# Patient Record
Sex: Male | Born: 1965 | Race: White | Hispanic: No | Marital: Married | Smoking: Never smoker
Health system: Southern US, Community
[De-identification: ages and names within clinical notes are randomized; demographics above are authoritative.]

## PROBLEM LIST (undated history)

## (undated) DIAGNOSIS — K219 Gastro-esophageal reflux disease without esophagitis: Secondary | ICD-10-CM

## (undated) DIAGNOSIS — E785 Hyperlipidemia, unspecified: Secondary | ICD-10-CM

## (undated) DIAGNOSIS — S92353A Displaced fracture of fifth metatarsal bone, unspecified foot, initial encounter for closed fracture: Secondary | ICD-10-CM

## (undated) HISTORY — PX: NO PAST SURGERIES: SHX2092

---

## 2007-10-12 ENCOUNTER — Ambulatory Visit: Payer: Self-pay | Admitting: Internal Medicine

## 2014-09-08 HISTORY — PX: FOOT SURGERY: SHX648

## 2016-05-01 ENCOUNTER — Ambulatory Visit
Admission: EM | Admit: 2016-05-01 | Discharge: 2016-05-01 | Disposition: A | Discharge status: Home / Self Care | Payer: BLUE CROSS/BLUE SHIELD | Attending: Family Medicine | Admitting: Family Medicine

## 2016-05-01 ENCOUNTER — Ambulatory Visit (INDEPENDENT_AMBULATORY_CARE_PROVIDER_SITE_OTHER): Payer: BLUE CROSS/BLUE SHIELD

## 2016-05-01 DIAGNOSIS — S99921A Unspecified injury of right foot, initial encounter: Secondary | ICD-10-CM

## 2016-05-01 DIAGNOSIS — S92354A Nondisplaced fracture of fifth metatarsal bone, right foot, initial encounter for closed fracture: Secondary | ICD-10-CM

## 2016-05-01 HISTORY — DX: Gastro-esophageal reflux disease without esophagitis: K21.9

## 2016-05-01 HISTORY — DX: Hyperlipidemia, unspecified: E78.5

## 2016-05-01 HISTORY — DX: Displaced fracture of fifth metatarsal bone, unspecified foot, initial encounter for closed fracture: S92.353A

## 2016-05-01 MED ORDER — HYDROCODONE-ACETAMINOPHEN 5-325 MG PO TABS: 1.0000 | ORAL_TABLET | Freq: Three times a day (TID) | ORAL | 0 refills | Status: DC | PRN

## 2016-05-01 MED ORDER — KETOROLAC TROMETHAMINE 60 MG/2ML IM SOLN
60.0000 mg | Freq: Once | INTRAMUSCULAR | Status: AC
  Administered 2016-05-01: 60 mg via INTRAMUSCULAR

## 2016-05-01 NOTE — ED Provider Notes (Signed)
[facility]    CSN: [id number] Arrival date & time: 05/01/16  1621  First Provider Contact:  First MD Initiated Contact with Patient 05/01/16 1658        History   Chief Complaint Chief Complaint  Patient presents with  . Foot Injury    HPI Clarence Adams is a 50 y.o. male.   Patient reports stepping off his deck about 2 hours ago the right foot is swollen and causing severe amount of pain. She's had difficulty putting weight on the foot.Marland Kitchen He states that the pain is ranging between a 7-9 out of 10 past smoker history he does not smoke no previous surgeries he has GERD and hyperlipidemia. No syncopal family medical history the past. Pertinent to today's visit   The history is provided by the patient. No language interpreter was used.  Foot Pain  This is a new problem. The current episode started 1 to 2 hours ago. The problem occurs constantly. The problem has been gradually worsening. Pertinent negatives include no chest pain, no abdominal pain, no headaches and no shortness of breath. The symptoms are aggravated by walking. Nothing relieves the symptoms. He has tried nothing for the symptoms. The treatment provided no relief.    Past Medical History:  Diagnosis Date  . Fracture of 5th metatarsal   . GERD (gastroesophageal reflux disease)   . Hyperlipidemia     There are no active problems to display for this patient.   Past Surgical History:  Procedure Laterality Date  . NO PAST SURGERIES         Home Medications    Prior to Admission medications   Medication Sig Start Date End Date Taking? Authorizing Provider  esomeprazole (NEXIUM) 40 MG capsule Take 40 mg by mouth daily at 12 noon.   Yes Historical Provider, MD  rosuvastatin (CRESTOR) 20 MG tablet Take 20 mg by mouth daily.   Yes Historical Provider, MD  HYDROcodone-acetaminophen (NORCO) 5-325 MG tablet Take 1 tablet by mouth every 8 (eight) hours as needed for moderate pain. 05/01/16   Frederich Cha, MD     Family History Family History  Problem Relation Age of Onset  . Aneurysm Father     Social History Social History  Substance Use Topics  . Smoking status: Never Smoker  . Smokeless tobacco: Never Used  . Alcohol use Yes     Comment: rarely     Allergies   Review of patient's allergies indicates no known allergies.   Review of Systems Review of Systems  Respiratory: Negative for shortness of breath.   Cardiovascular: Negative for chest pain.  Gastrointestinal: Negative for abdominal pain.  Musculoskeletal: Positive for arthralgias, joint swelling and myalgias.  Neurological: Negative for headaches.  All other systems reviewed and are negative.    Physical Exam Triage Vital Signs ED Triage Vitals  Enc Vitals Group     BP 05/01/16 1645 (!) 134/98     Pulse Rate 05/01/16 1645 68     Resp 05/01/16 1645 16     Temp 05/01/16 1645 97.6 F (36.4 C)     Temp Source 05/01/16 1645 Oral     SpO2 05/01/16 1645 97 %     Weight 05/01/16 1645 160 lb (72.6 kg)     Height 05/01/16 1645 5\' 11"  (1.803 m)     Head Circumference --      Peak Flow --      Pain Score 05/01/16 1648 9     Pain Loc --  Pain Edu? --      Excl. in [place]? --    No data found.   Updated Vital Signs BP (!) 134/98 (BP Location: Left Arm)   Pulse 68   Temp 97.6 F (36.4 C) (Oral)   Resp 16   Ht 5\' 11"  (1.803 m)   Wt 160 lb (72.6 kg)   SpO2 97%   BMI 22.32 kg/m   Visual Acuity Right Eye Distance:   Left Eye Distance:   Bilateral Distance:    Right Eye Near:   Left Eye Near:    Bilateral Near:     Physical Exam  Constitutional: He is oriented to person, place, and time. He appears well-developed and well-nourished.  HENT:  Head: Normocephalic.  Eyes: Pupils are equal, round, and reactive to light.  Neck: Normal range of motion.  Musculoskeletal: He exhibits edema and tenderness.       Right ankle: He exhibits decreased range of motion, swelling and ecchymosis. Tenderness. Achilles  tendon exhibits pain.       Feet:  Neurological: He is alert and oriented to person, place, and time.  Skin: Skin is warm.     UC Treatments / Results  Labs (all labs ordered are listed, but only abnormal results are displayed) Labs Reviewed - No data to display  EKG  EKG Interpretation None       Radiology Dg Foot Complete Right  Result Date: 05/01/2016 CLINICAL DATA:  Lateral foot pain/injury EXAM: RIGHT FOOT COMPLETE - 3+ VIEW COMPARISON:  None. FINDINGS: Minimally displaced fracture involving the base of the 5th metatarsal. Overlying mild soft tissue swelling. The joint spaces are preserved. IMPRESSION: Minimally displaced fracture involving the base of the 5th metatarsal. Electronically Signed   By: Julian Hy M.D.   On: 05/01/2016 17:55    Procedures Procedures (including critical care time)  Medications Ordered in UC Medications  ketorolac (TORADOL) injection 60 mg (60 mg Intramuscular Given 05/01/16 1756)     Initial Impression / Assessment and Plan / UC Course  I have reviewed the triage vital signs and the nursing notes.  Pertinent labs & imaging results that were available during my care of the patient were reviewed by me and considered in my medical decision making (see chart for details).  Clinical Course   Patient was informed of the fracture of the fifth metatarsal bone proximally. Will put patient in a cam boot and recommend follow-up with podiatrist Dr. Vickki Muff next week. Patient also be given Vicodin for pain. He does not need a work note since she's finishing up his 4 weeks vacation and has tobacco, Monday. He has some concern about staying on his feet explained to him that Dr. Vickki Muff would be the one to decide how much standing he'll be up to do.   Final Clinical Impressions(s) / UC Diagnoses   Final diagnoses:  Nondisplaced fracture of fifth metatarsal bone, right foot, initial encounter for closed fracture  Foot injury, right, initial  encounter    New Prescriptions Discharge Medication List as of 05/01/2016  6:19 PM    START taking these medications   Details  HYDROcodone-acetaminophen (NORCO) 5-325 MG tablet Take 1 tablet by mouth every 8 (eight) hours as needed for moderate pain., Starting Thu 05/01/2016, Normal         Frederich Cha, MD 05/01/16 [phone]

## 2016-05-01 NOTE — ED Notes (Signed)
Patient shows no signs of adverse reaction to medication at this time.  

## 2016-05-01 NOTE — ED Triage Notes (Signed)
Patient complains of right foot pain. Patient states that today around 3pm he was taking a big step down and felt a pop in his right foot. Patient has right foot swelling, bruising and pain that has been constant since injury.

## 2016-05-02 ENCOUNTER — Encounter: Payer: Self-pay | Admitting: Sports Medicine

## 2016-05-02 ENCOUNTER — Ambulatory Visit (INDEPENDENT_AMBULATORY_CARE_PROVIDER_SITE_OTHER): Payer: BLUE CROSS/BLUE SHIELD | Admitting: Sports Medicine

## 2016-05-02 DIAGNOSIS — M79672 Pain in left foot: Secondary | ICD-10-CM | POA: Diagnosis not present

## 2016-05-02 DIAGNOSIS — M25475 Effusion, left foot: Secondary | ICD-10-CM | POA: Diagnosis not present

## 2016-05-02 DIAGNOSIS — S92352A Displaced fracture of fifth metatarsal bone, left foot, initial encounter for closed fracture: Secondary | ICD-10-CM | POA: Diagnosis not present

## 2016-05-02 NOTE — Patient Instructions (Signed)
Pre-Operative Instructions  Congratulations, you have decided to take an important step to improving your quality of life.  You can be assured that the doctors of [facility] will be with you every step of the way.  1. Plan to be at the surgery center/hospital at least 1 (one) hour prior to your scheduled time unless otherwise directed by the surgical center/hospital staff.  You must have a responsible adult accompany you, remain during the surgery and drive you home.  Make sure you have directions to the surgical center/hospital and know how to get there on time. 2. For hospital based surgery you will need to obtain a history and physical form from your family physician within 1 month prior to the date of surgery- we will give you a form for you primary physician.  3. We make every effort to accommodate the date you request for surgery.  There are however, times where surgery dates or times have to be moved.  We will contact you as soon as possible if a change in schedule is required.   4. No Aspirin/Ibuprofen for one week before surgery.  If you are on aspirin, any non-steroidal anti-inflammatory medications (Mobic, Aleve, Ibuprofen) you should stop taking it 7 days prior to your surgery.  You make take Tylenol  For pain prior to surgery.  5. Medications- If you are taking daily heart and blood pressure medications, seizure, reflux, allergy, asthma, anxiety, pain or diabetes medications, make sure the surgery center/hospital is aware before the day of surgery so they may notify you which medications to take or avoid the day of surgery. 6. No food or drink after midnight the night before surgery unless directed otherwise by surgical center/hospital staff. 7. No alcoholic beverages 24 hours prior to surgery.  No smoking 24 hours prior to or 24 hours after surgery. 8. Wear loose pants or shorts- loose enough to fit over bandages, boots, and casts. 9. No slip on shoes, sneakers are best. 10. Bring  your boot with you to the surgery center/hospital.  Also bring crutches or a walker if your physician has prescribed it for you.  If you do not have this equipment, it will be provided for you after surgery. 11. If you have not been contracted by the surgery center/hospital by the day before your surgery, call to confirm the date and time of your surgery. 12. Leave-time from work may vary depending on the type of surgery you have.  Appropriate arrangements should be made prior to surgery with your employer. 13. Prescriptions will be provided immediately following surgery by your doctor.  Have these filled as soon as possible after surgery and take the medication as directed. 14. Remove nail polish on the operative foot. 15. Wash the night before surgery.  The night before surgery wash the foot and leg well with the antibacterial soap provided and water paying special attention to beneath the toenails and in between the toes.  Rinse thoroughly with water and dry well with a towel.  Perform this wash unless told not to do so by your physician.  Enclosed: 1 Ice pack (please put in freezer the night before surgery)   1 Hibiclens skin cleaner   Pre-op Instructions  If you have any questions regarding the instructions, do not hesitate to call our office.  [place]: [address] [place], [place] [address] [phone]  [place]: [address] [place], [place] [address] [phone]  [place]: [address]  [place], [place] [address] [phone]   Dr.   Ila Mcgill DPM, Dr. Celesta Gentile DPM, Dr. Lanelle Bal DPM, Dr. Landis Martins DPM, Dr. Edrick Kins DPM

## 2016-05-03 NOTE — Progress Notes (Signed)
Subjective: Clarence Adams is a 50 y.o. male patient who presents to office for evaluation of Right foot pain. Patient complains of progressive pain right foot after stepping off porch. Went to [facility] yesterday where he got xrays and was placed in CAM boot for fracture. Reports pain most with walking. Patient denies any other pedal complaints.   Patient seen and evaluated with Dr. Evans.   There are no active problems to display for this patient.   Current Outpatient Prescriptions on File Prior to Visit  Medication Sig Dispense Refill  . esomeprazole (NEXIUM) 40 MG capsule Take 40 mg by mouth daily at 12 noon.    . HYDROcodone-acetaminophen (NORCO) 5-325 MG tablet Take 1 tablet by mouth every 8 (eight) hours as needed for moderate pain. 20 tablet 0  . rosuvastatin (CRESTOR) 20 MG tablet Take 20 mg by mouth daily.     No current facility-administered medications on file prior to visit.     No Known Allergies  Objective:  General: Alert and oriented x3 in no acute distress  Dermatology: No open lesions bilateral lower extremities, no webspace macerations, no ecchymosis bilateral, all nails x 10 are well manicured.  Vascular: Focal edema noted to right foot lateral aspect. Dorsalis Pedis and Posterior Tibial pedal pulses 2/4, Capillary Fill Time 3 seconds,(+) pedal hair growth bilateral, Temperature gradient within normal limits.  Neurology: Gross sensation intact via light touch bilateral.  Musculoskeletal: There is tenderness with palpation at 5th met base on Right foot,No pain with calf compression bilateral. All joint range of motion is within normal limits except with inversion on right where there is mild guarding secondary to pain, Strength within normal limits in all groups bilateral.   Xrays Right foot 05-01-16 IMPRESSION: Minimally displaced fracture involving the base of the 5th metatarsal.    Assessment and Plan: Problem List Items Addressed This Visit    None     Visit Diagnoses    Fracture of fifth metatarsal bone of left foot, closed, initial encounter    -  Primary   Relevant Orders   DME Other see comment   Swelling of foot joint, left       Relevant Orders   DME Other see comment   Pain of left foot       Relevant Orders   DME Other see comment      -Complete examination performed -Xrays reviewed -Discussed treatement options for fracture; risks, alternatives, and benefits explained. -Patient opt for surgical management. Consent obtained for Right 5th metatarsal fracture reduction and placement of screw with Dr. Evans. Pre and Post op course explained. Risks, benefits, alternatives explained. No guarantees given or implied. Surgical booking slip submitted and provided patient with Surgical packet and info for [facility]. -Recommend 12 weeks medical leave for recovery post op -Continue with CAM walker to patient to wear at all times and instructed on use; Advised patient to return to using his crutches  -Ordered Rolling knee scooter for patient to have for use as well -Recommend protection, rest, ice, elevation daily  -Continue with Norco as needed for pain  -Patient to return to office after surgery or sooner if condition worsens.   , DPM  

## 2016-05-05 ENCOUNTER — Telehealth: Payer: Self-pay | Admitting: *Deleted

## 2016-05-05 NOTE — Telephone Encounter (Signed)
"  We're trying to figure out what is going on with my husband's surgery.  I tried to go on-line to get it set up and I couldn't.  They were asking me the doctor's name and the date of surgery and I didn't know either."  I'll have to call you back once I pull up his information.  He is a patient of Dr. Cannon Kettle."  "Okay, that will be fine."   I'm returning your call.  I'm sorry, he is a patient of Dr. Amalia Hailey.  He is scheduled already for surgery on 05/08/2016.   "He didn't know, no one called to tell him.  "No one told him while he was there on Friday seeing Dr. Amalia Hailey.  "No one one told him anything.  Where does he have to go? Are there directions on how to get there?"  Surgery is done at [facility], a brochure should be in his blue bag.  Did he get the blue bag?  "Yes, he did get the blue bag."  The brochure has all the information he needs, address is on the back, directions on how to get there is located on the back inner page and instructions on how to register is in the brochure.  "What time will he need to be there?"  He will receive a call from someone from the surgical center 1-2 days prior to surgery date.  "Okay, wish me luck.  I don't know if I'm ready for this."

## 2016-05-05 NOTE — Telephone Encounter (Signed)
"  I was calling to see if Dr. Amalia Hailey was doing a block or regular general.  I need to know because it matters in determining what time he needs to be here."

## 2016-05-07 NOTE — Telephone Encounter (Signed)
I called and informed Clarence Adams that Dr.Evans said he does not want a block.

## 2016-05-08 ENCOUNTER — Encounter: Payer: Self-pay | Admitting: Podiatry

## 2016-05-08 DIAGNOSIS — M21542 Acquired clubfoot, left foot: Secondary | ICD-10-CM | POA: Diagnosis not present

## 2016-05-09 NOTE — Progress Notes (Signed)
DOS 05/08/2016 Open reduction internal fixation 5th metatarsal fracture right foot. Any other indicated procedures.

## 2016-05-16 ENCOUNTER — Encounter: Payer: Self-pay | Admitting: Podiatry

## 2016-05-16 ENCOUNTER — Ambulatory Visit (INDEPENDENT_AMBULATORY_CARE_PROVIDER_SITE_OTHER): Payer: BLUE CROSS/BLUE SHIELD | Admitting: Podiatry

## 2016-05-16 DIAGNOSIS — S92354A Nondisplaced fracture of fifth metatarsal bone, right foot, initial encounter for closed fracture: Secondary | ICD-10-CM

## 2016-05-16 DIAGNOSIS — M79671 Pain in right foot: Secondary | ICD-10-CM

## 2016-05-16 DIAGNOSIS — M25474 Effusion, right foot: Secondary | ICD-10-CM

## 2016-05-16 DIAGNOSIS — M25475 Effusion, left foot: Secondary | ICD-10-CM

## 2016-05-16 DIAGNOSIS — M79672 Pain in left foot: Secondary | ICD-10-CM

## 2016-05-16 DIAGNOSIS — S92352A Displaced fracture of fifth metatarsal bone, left foot, initial encounter for closed fracture: Secondary | ICD-10-CM

## 2016-05-16 MED ORDER — TRAMADOL HCL 50 MG PO TABS: 50.0000 mg | ORAL_TABLET | Freq: Three times a day (TID) | ORAL | 0 refills | Status: DC | PRN

## 2016-05-16 NOTE — Progress Notes (Signed)
Subjective:  Patient presents today 1 week postop ORIF of a right fifth metatarsal fracture. Patient states that he is doing well however the pain medication prescribed to him with some jittery. Patient does complain of some pain and tenderness relating to the postoperative recovery.  Objective:  Incision site well coapted with sutures intact postoperative x-rays reveal correct alignment with no change immediately postoperatively versus today. Fracture fragment stable with orthopedic screw and correct placement.  Assessment:  1 week postop ORIF right fifth metatarsal avulsion fracture.  Plan of care:  #1 patient was evaluated #2 prescription for tramadol was given to the patient today #3 continue nonweightbearing with a cam boot for approximately 3 more weeks. #4 continue to keep dressings clean dry and intact until instructed otherwise #5 patient is to return to clinic in 1 week for suture removal

## 2016-05-22 NOTE — Progress Notes (Signed)
DOS 08.31.2017 Open reduction internal fixation fifth metatarsal fracture right foot; any other indicated procedure

## 2016-05-23 ENCOUNTER — Encounter: Payer: Self-pay | Admitting: Podiatry

## 2016-05-23 ENCOUNTER — Ambulatory Visit (INDEPENDENT_AMBULATORY_CARE_PROVIDER_SITE_OTHER): Payer: BLUE CROSS/BLUE SHIELD | Admitting: Podiatry

## 2016-05-23 VITALS — BP 116/77 | HR 66 | Resp 16

## 2016-05-23 DIAGNOSIS — M25475 Effusion, left foot: Secondary | ICD-10-CM

## 2016-05-23 DIAGNOSIS — S92351D Displaced fracture of fifth metatarsal bone, right foot, subsequent encounter for fracture with routine healing: Secondary | ICD-10-CM

## 2016-05-23 DIAGNOSIS — Z9889 Other specified postprocedural states: Secondary | ICD-10-CM

## 2016-05-26 NOTE — Progress Notes (Addendum)
Subjective: Patient presents today 2 weeks postop ORIF right fifth metatarsal fracture. Patient states these has minimal pain. Patient appears to be doing much better.  Objective: Incision site well coapted. No signs of erythema or infection.  Assessment: 2 weeks postop ORIF right fifth metatarsal avulsion fracture  Plan of care:  #1 the patient was evaluated  #2 patient is to continue tramadol as needed #3 sutures were removed today #4 patient's continue nonweightbearing in the cam boot for 2 more weeks #5 patient can begin showering in 2 days from today  #6 patient is to return to clinic in 2 weeks. Follow-up x-rays will be taken. At this point patient may begin partial weightbearing if x-rays appear satisfactory.

## 2016-05-30 ENCOUNTER — Encounter: Payer: Self-pay | Admitting: Podiatry

## 2016-05-30 ENCOUNTER — Telehealth: Payer: Self-pay | Admitting: Podiatry

## 2016-05-30 NOTE — Telephone Encounter (Signed)
pts wife Kathycalled and they are needing a note from our office for pts work.It needs to be from when pt was 1st seen and surgery info and when pt is going to be able to return to work. If we could fax to [employer] and the fax # is [phone].

## 2016-05-30 NOTE — Telephone Encounter (Signed)
Yes I'll get it done now. Thanks!

## 2016-06-06 ENCOUNTER — Telehealth: Payer: Self-pay | Admitting: *Deleted

## 2016-06-06 NOTE — Telephone Encounter (Signed)
[place]ell[place] - [employer] request op notes from Dr. Amalia Hailey 05/08/2016 surgery to be faxed to [phone], Claim# [id number].

## 2016-06-09 NOTE — Telephone Encounter (Signed)
Just faxed operative report.

## 2016-06-10 ENCOUNTER — Ambulatory Visit (INDEPENDENT_AMBULATORY_CARE_PROVIDER_SITE_OTHER): Payer: BLUE CROSS/BLUE SHIELD

## 2016-06-10 ENCOUNTER — Ambulatory Visit (INDEPENDENT_AMBULATORY_CARE_PROVIDER_SITE_OTHER): Payer: BLUE CROSS/BLUE SHIELD | Admitting: Podiatry

## 2016-06-10 ENCOUNTER — Ambulatory Visit: Payer: BLUE CROSS/BLUE SHIELD

## 2016-06-10 DIAGNOSIS — Z9889 Other specified postprocedural states: Secondary | ICD-10-CM

## 2016-06-10 DIAGNOSIS — M25474 Effusion, right foot: Secondary | ICD-10-CM | POA: Diagnosis not present

## 2016-06-10 DIAGNOSIS — M25475 Effusion, left foot: Secondary | ICD-10-CM

## 2016-06-10 DIAGNOSIS — S92351D Displaced fracture of fifth metatarsal bone, right foot, subsequent encounter for fracture with routine healing: Secondary | ICD-10-CM

## 2016-06-15 NOTE — Progress Notes (Signed)
Subjective: Patient presents today for his third postop visit for ORIF of the right fifth metatarsal fracture. Patient states that he is doing okay and his pain is manageable.  Objective: Incision site appears to be healed. X-rays taken today with orthopedic hardware intact and evidence of bone consolidation.  Assessment: Status post ORIF right fifth metatarsal fracture.  Plan of care: Today the patient and will start partial weightbearing and transition to full weightbearing beginning on 06/17/2016. This is weightbearing in a cam boot. Patient is to return to clinic in 2 weeks

## 2016-06-20 ENCOUNTER — Ambulatory Visit (INDEPENDENT_AMBULATORY_CARE_PROVIDER_SITE_OTHER): Payer: BLUE CROSS/BLUE SHIELD | Admitting: Podiatry

## 2016-06-20 ENCOUNTER — Encounter: Payer: Self-pay | Admitting: Podiatry

## 2016-06-20 DIAGNOSIS — Z9889 Other specified postprocedural states: Secondary | ICD-10-CM

## 2016-06-20 DIAGNOSIS — S92351D Displaced fracture of fifth metatarsal bone, right foot, subsequent encounter for fracture with routine healing: Secondary | ICD-10-CM

## 2016-06-22 NOTE — Progress Notes (Signed)
Subjective: Patient presents today for his fourth postop visit for ORIF of the fifth metatarsal fracture right foot. Patient states that he's doing okay this pain is manageable.  Patient is transitioned from nonweightbearing to walking in his cam boot. Patient does notice some tingling during ambulation.  Objective: Incision site healed. Edema minimal.  Assessment: Status post ORIF right fifth metatarsal fracture  Plan of care: Today the patient was evaluated and we'll begin to transition from full weightbearing in the cam boot to good supportive tennis shoes beginning on November 1.  Patient is to return to clinic in 3 months.

## 2016-06-27 ENCOUNTER — Encounter: Payer: BLUE CROSS/BLUE SHIELD | Admitting: Podiatry

## 2016-07-08 ENCOUNTER — Encounter: Payer: Self-pay | Admitting: *Deleted

## 2016-07-08 ENCOUNTER — Telehealth: Payer: Self-pay | Admitting: *Deleted

## 2016-07-08 NOTE — Telephone Encounter (Signed)
Yes, patient can return to work. Full duty no restrictions.   Thanks,  Dr. Amalia Hailey

## 2016-07-08 NOTE — Telephone Encounter (Addendum)
Pt states he needs a return to work note faxed to [employer] [phone], he already started back yesterday. Return to work letter faxed [employer].

## 2016-09-16 ENCOUNTER — Ambulatory Visit (INDEPENDENT_AMBULATORY_CARE_PROVIDER_SITE_OTHER): Payer: BLUE CROSS/BLUE SHIELD | Admitting: Podiatry

## 2016-09-16 ENCOUNTER — Ambulatory Visit (INDEPENDENT_AMBULATORY_CARE_PROVIDER_SITE_OTHER): Payer: BLUE CROSS/BLUE SHIELD

## 2016-09-16 DIAGNOSIS — S92351D Displaced fracture of fifth metatarsal bone, right foot, subsequent encounter for fracture with routine healing: Secondary | ICD-10-CM | POA: Diagnosis not present

## 2016-09-21 NOTE — Progress Notes (Signed)
Subjective: Patient presents today status post ORIF to the fifth metatarsal right foot. Patient states he has no pain.  Objective: Incision site healed. Evidence of osseous healing on radiographic exam today.  Assessment: Status post ORIF fifth metatarsal fracture right foot-healed  Plan of care: Patient is discharged from today. Full duty no restrictions. Return to clinic when necessary

## 2016-09-30 ENCOUNTER — Telehealth: Payer: Self-pay | Admitting: *Deleted

## 2016-09-30 NOTE — Telephone Encounter (Signed)
[place] - Dr. Ewell Poe asked if pt would need to be pre-medicated for dental surgery. Pt had internal fixation of a fracture on DOS 05/08/2016 by Dr. Amalia Hailey and will need to be premedicated. I faxed a copy of the surgical procedure and date of surgery and rx stating pt would need pre-medication prior to dental procedures.

## 2017-04-20 ENCOUNTER — Other Ambulatory Visit: Payer: Self-pay | Admitting: Internal Medicine

## 2017-04-20 DIAGNOSIS — R110 Nausea: Secondary | ICD-10-CM

## 2017-04-22 ENCOUNTER — Ambulatory Visit
Admission: RE | Admit: 2017-04-22 | Discharge: 2017-04-22 | Disposition: A | Discharge status: Home / Self Care | Payer: 59 | Source: Ambulatory Visit | Attending: Internal Medicine | Admitting: Internal Medicine

## 2017-04-22 DIAGNOSIS — R110 Nausea: Secondary | ICD-10-CM | POA: Diagnosis present

## 2017-04-22 DIAGNOSIS — K824 Cholesterolosis of gallbladder: Secondary | ICD-10-CM | POA: Diagnosis not present

## 2018-06-22 ENCOUNTER — Ambulatory Visit: Payer: BLUE CROSS/BLUE SHIELD | Admitting: Podiatry

## 2018-06-22 ENCOUNTER — Encounter: Payer: Self-pay | Admitting: Podiatry

## 2018-06-22 DIAGNOSIS — L989 Disorder of the skin and subcutaneous tissue, unspecified: Secondary | ICD-10-CM | POA: Diagnosis not present

## 2018-06-25 NOTE — Progress Notes (Signed)
   Subjective: 52 year old male presenting today with a chief complaint of a painful callus lesion of the left foot that appeared several weeks ago. He only notes pain when walking barefoot. He has not done anything for treatment. Patient is here for further evaluation and treatment.   Past Medical History:  Diagnosis Date  . Fracture of 5th metatarsal   . GERD (gastroesophageal reflux disease)   . Hyperlipidemia      Objective:  Physical Exam General: Alert and oriented x3 in no acute distress  Dermatology: Hyperkeratotic lesion present on the left foot. Pain on palpation with a central nucleated core noted. Skin is warm, dry and supple bilateral lower extremities. Negative for open lesions or macerations.  Vascular: Palpable pedal pulses bilaterally. No edema or erythema noted. Capillary refill within normal limits.  Neurological: Epicritic and protective threshold grossly intact bilaterally.   Musculoskeletal Exam: Pain on palpation at the keratotic lesion noted. Range of motion within normal limits bilateral. Muscle strength 5/5 in all groups bilateral.  Assessment: 1. Porokeratosis left foot    Plan of Care:  1. Patient evaluated 2. Excisional debridement of keratoic lesion using a chisel blade was performed without incident. Salinocaine applied to area.  3. Dressed area with light dressing. 4. Patient is to return to the clinic PRN.   Edrick Kins, DPM [facility]  Dr. Edrick Kins, [address]                                        [place], [place] [address]                Office [phone]  Fax [phone]

## 2018-06-27 IMAGING — US US ABDOMEN COMPLETE
1 series · 13 of 25 positions shown · non-contrast
Comparison: None.

CLINICAL DATA: Three-day history of nausea

EXAM:
ABDOMEN ULTRASOUND COMPLETE

[Series 1: us abdomen complete · 0.19mm/px · 13 of 112 slices shown]
[im 1/112]
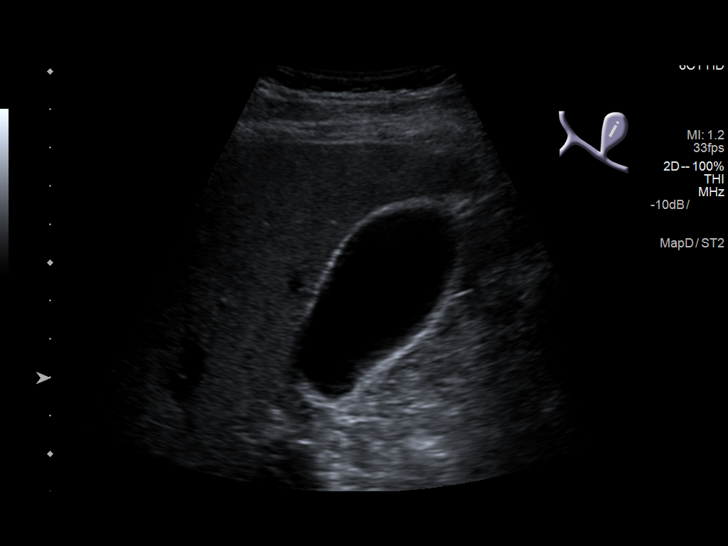
[im 10/112]
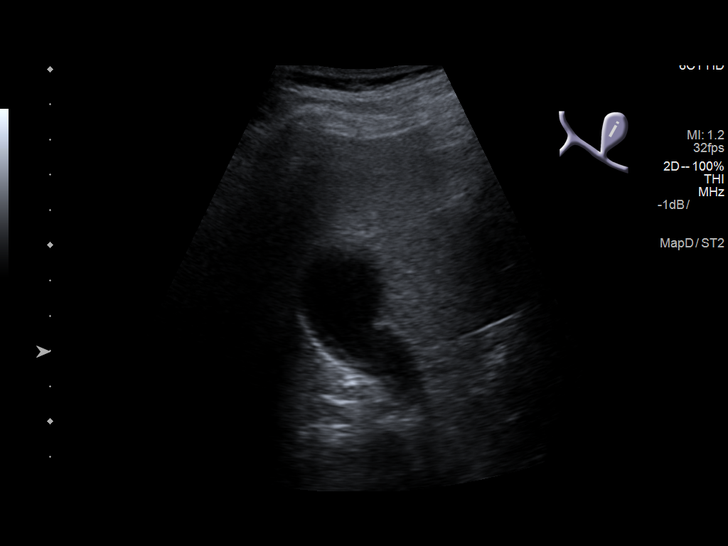
[im 19/112]
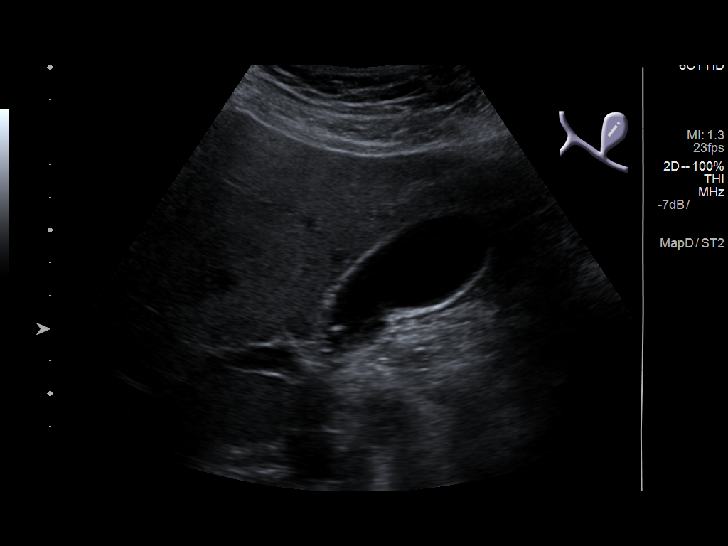
[im 28/112]
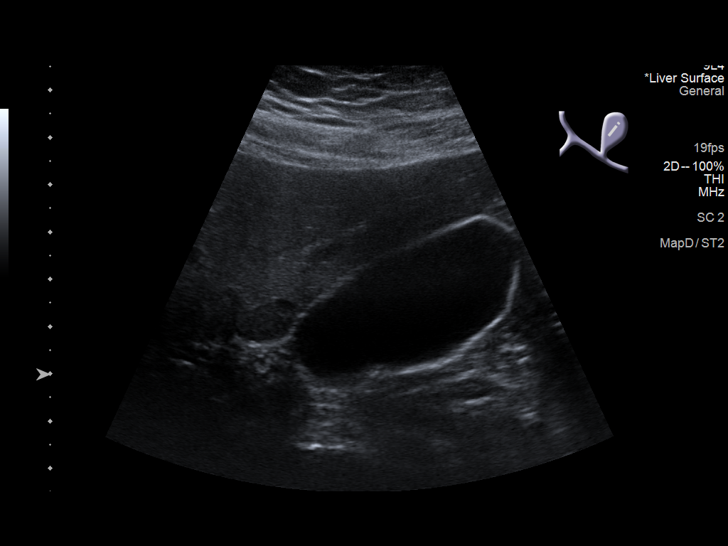
[im 38/112]
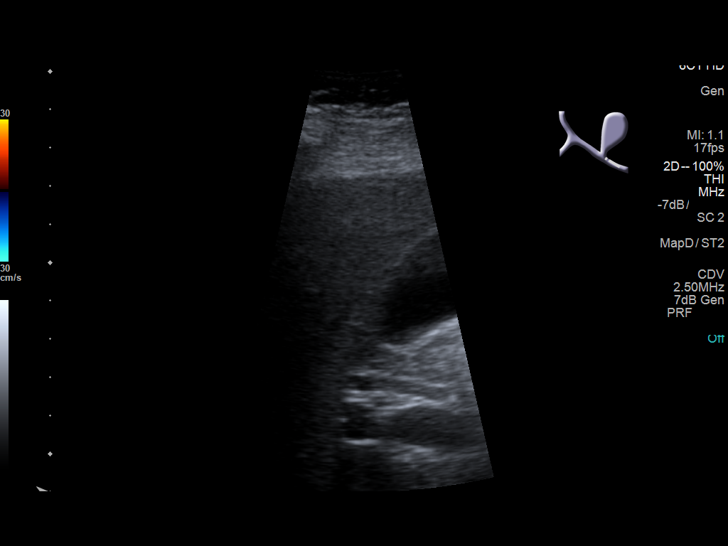
[im 47/112]
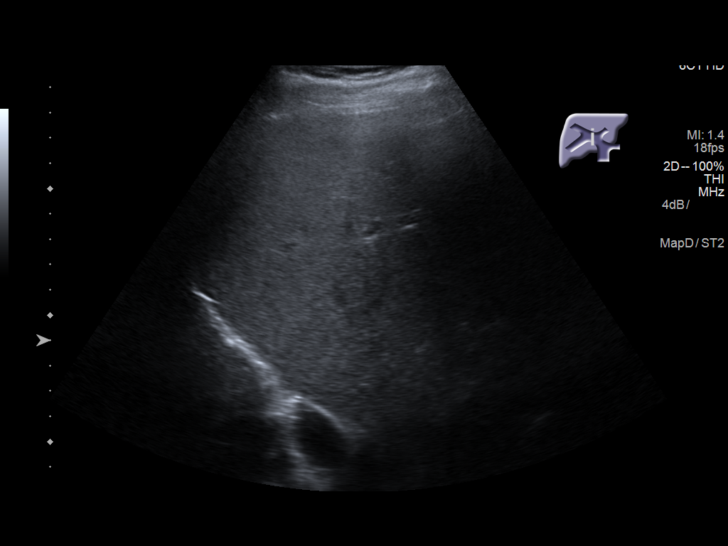
[im 56/112]
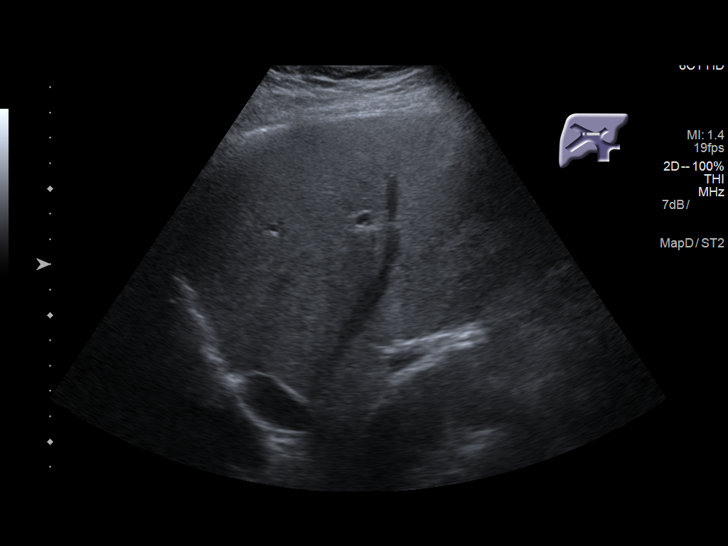
[im 65/112]
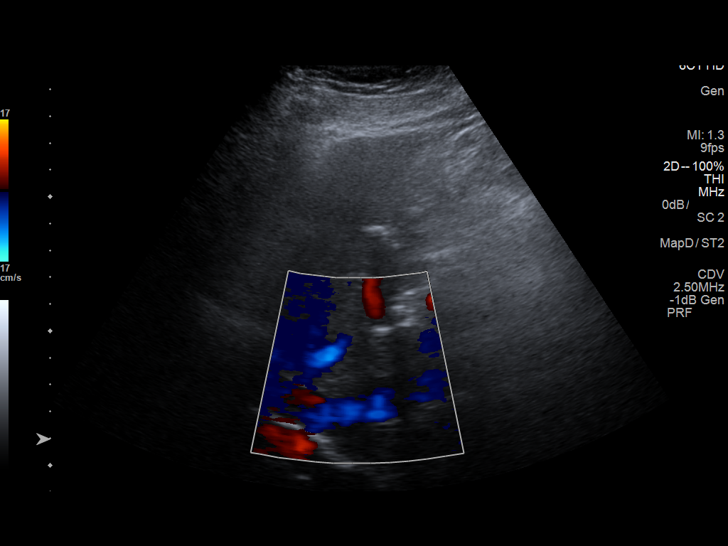
[im 75/112]
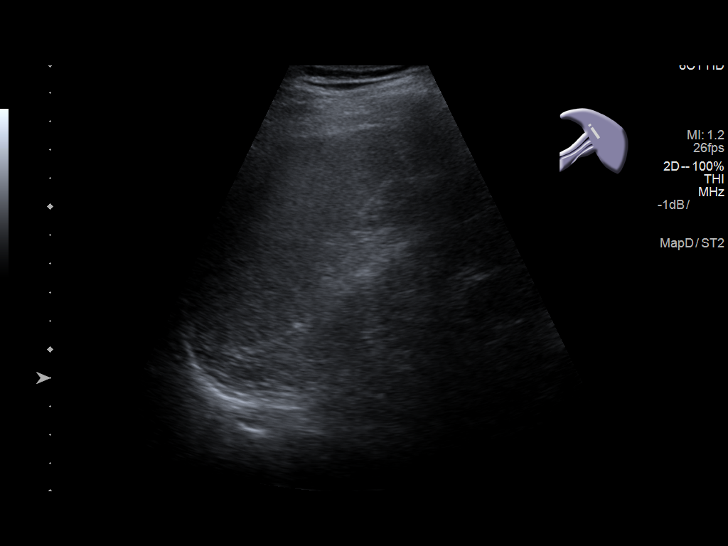
[im 84/112]
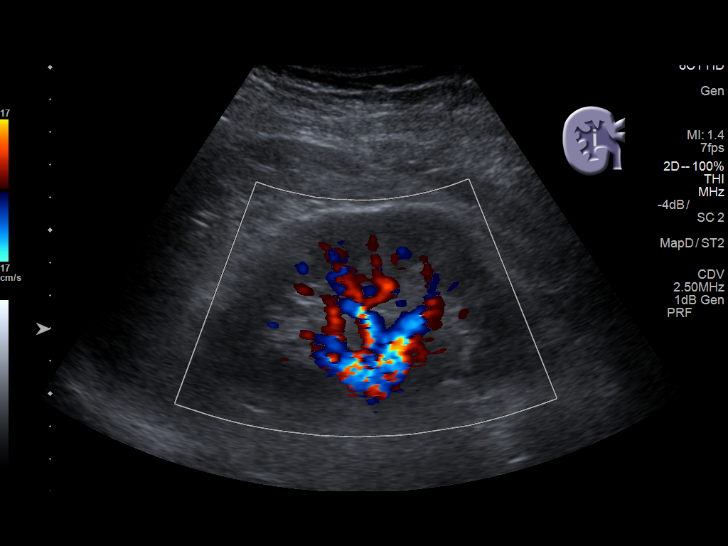
[im 93/112]
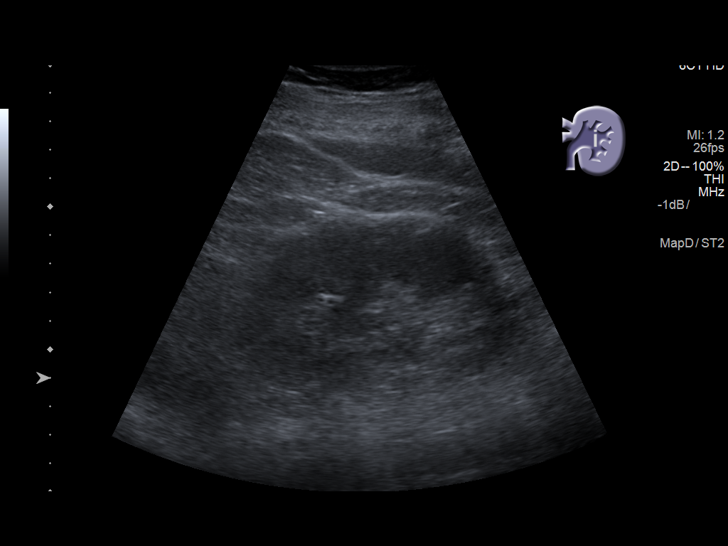
[im 102/112]
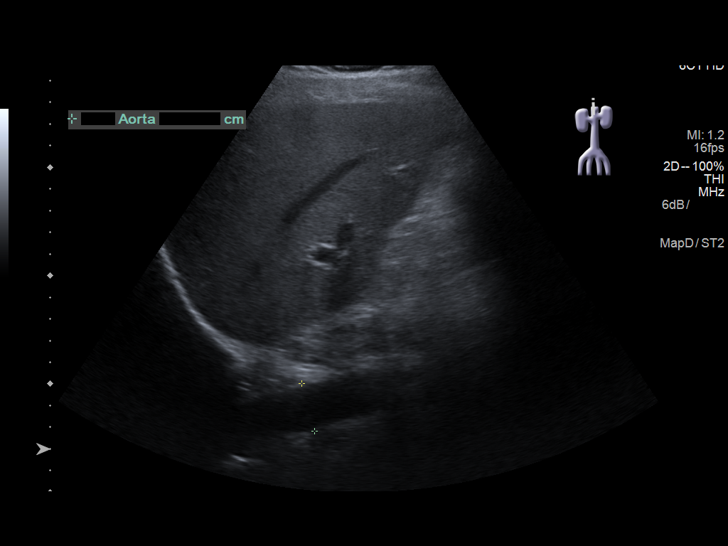
[im 112/112]
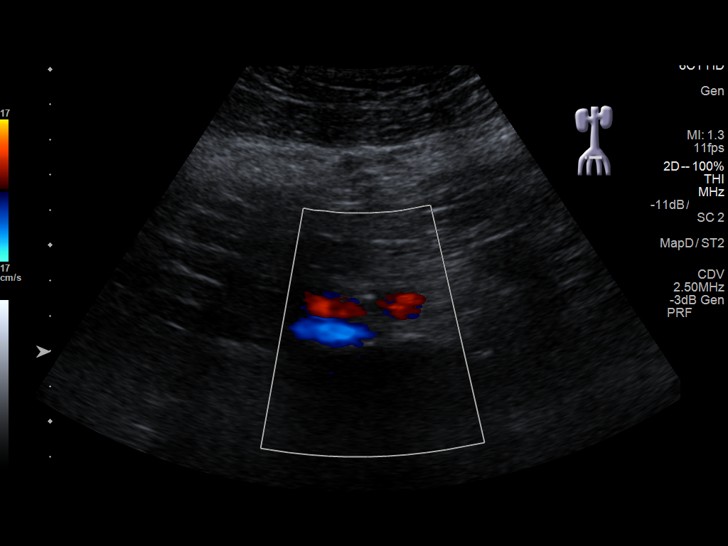

[13 of 25 positions shown; findings below may reference images not displayed]

FINDINGS: Gallbladder: There is a 5 mm echogenic focus in the neck of the
gallbladder which neither moves nor shadows, felt to represent a
small polyp. A similar appearing polyp is noted in the body of the
gallbladder measuring 3 mm. No larger polypoid areas are evident.
There are no echogenic foci in the gallbladder which move and shadow
as is expected with gallstones. No gallbladder wall thickening or
pericholecystic fluid. No sonographic Murphy sign noted by
sonographer.

Common bile duct: Diameter: 3 mm. No intrahepatic, common hepatic,
or common bile duct dilatation.

Liver: No focal lesion identified. Within normal limits in
parenchymal echogenicity. Flow in the portal vein is in the anatomic
direction.

IVC: No abnormality visualized in areas which can be interrogated.
Much of the infrahepatic inferior vena cava is obscured by gas.

Pancreas: Pancreas is virtually completely obscured by gas.

Spleen: Size and appearance within normal limits.

Right Kidney: Length: 10.9 cm. Echogenicity within normal limits. No
mass or hydronephrosis visualized.

Left Kidney: Length: 10.1 cm. Echogenicity within normal limits. No
mass or hydronephrosis visualized.

Abdominal aorta: No aneurysm visualized.

Other findings: No demonstrable ascites.
IMPRESSION: 1. Small gallbladder polyps, largest measuring 5 mm in length.
Polyps of this small size do not warrant additional imaging
surveillance per consensus guidelines. Gallbladder otherwise normal
in appearance.

2. Pancreas essentially completely obscured by gas. Much of the
inferior vena cava obscured by gas. Visualized portions of inferior
vena cava appear normal.

3.  Study otherwise unremarkable.

## 2018-11-09 ENCOUNTER — Ambulatory Visit: Payer: BLUE CROSS/BLUE SHIELD | Admitting: General Surgery

## 2018-11-09 ENCOUNTER — Encounter: Payer: Self-pay | Admitting: General Surgery

## 2018-11-09 ENCOUNTER — Other Ambulatory Visit: Payer: Self-pay

## 2018-11-09 VITALS — BP 139/86 | HR 60 | Temp 97.8°F | Ht 71.0 in | Wt 166.0 lb

## 2018-11-09 DIAGNOSIS — K219 Gastro-esophageal reflux disease without esophagitis: Secondary | ICD-10-CM | POA: Insufficient documentation

## 2018-11-09 DIAGNOSIS — Z1211 Encounter for screening for malignant neoplasm of colon: Secondary | ICD-10-CM

## 2018-11-09 MED ORDER — POLYETHYLENE GLYCOL 3350 17 GM/SCOOP PO POWD: ORAL | 0 refills | Status: DC

## 2018-11-09 NOTE — Patient Instructions (Addendum)
The patient is scheduled for a Colonoscopy at [facility] on 12/08/18. They are aware to call the day before to get their arrival time. He will stop his Fish Oil one week prior. Miralax prescription has been sent into the patient's pharmacy. The patient is aware of date and instructions.   Colonoscopy, Adult A colonoscopy is an exam to look at the entire large intestine. During the exam, a lubricated, flexible tube that has a camera on the end of it is inserted into the anus and then passed into the rectum, colon, and other parts of the large intestine. You may have a colonoscopy as a part of normal colorectal screening or if you have certain symptoms, such as:  Lack of red blood cells (anemia).  Diarrhea that does not go away.  Abdominal pain.  Blood in your stool (feces). A colonoscopy can help screen for and diagnose medical problems, including:  Tumors.  Polyps.  Inflammation.  Areas of bleeding. Tell a health care provider about:  Any allergies you have.  All medicines you are taking, including vitamins, herbs, eye drops, creams, and over-the-counter medicines.  Any problems you or family members have had with anesthetic medicines.  Any blood disorders you have.  Any surgeries you have had.  Any medical conditions you have.  Any problems you have had passing stool. What are the risks? Generally, this is a safe procedure. However, problems may occur, including:  Bleeding.  A tear in the intestine.  A reaction to medicines given during the exam.  Infection (rare). What happens before the procedure? Eating and drinking restrictions Follow instructions from your health care provider about eating and drinking, which may include:  A few days before the procedure - follow a low-fiber diet. Avoid nuts, seeds, dried fruit, raw fruits, and vegetables.  1-3 days before the procedure - follow a clear liquid diet. Drink only clear liquids, such as clear broth or bouillon, black  coffee or tea, clear juice, clear soft drinks or sports drinks, gelatin dessert, and popsicles. Avoid any liquids that contain red or purple dye.  On the day of the procedure - do not eat or drink anything starting 2 hours before the procedure, or within the time period that your health care provider recommends. Up to 2 hours before the procedure, you may continue to drink clear liquids, such as water or clear fruit juice. Bowel prep If you were prescribed an oral bowel prep to clean out your colon:  Take it as told by your health care provider. Starting the day before your procedure, you will need to drink a large amount of medicated liquid. The liquid will cause you to have multiple loose stools until your stool is almost clear or light green.  If your skin or anus gets irritated from diarrhea, you may use these to relieve the irritation: ? Medicated wipes, such as adult wet wipes with aloe and vitamin E. ? A skin-soothing product like petroleum jelly.  If you vomit while drinking the bowel prep, take a break for up to 60 minutes and then begin the bowel prep again. If vomiting continues and you cannot take the bowel prep without vomiting, call your health care provider.  To clean out your colon, you may also be given: ? Laxative medicines. ? Instructions about how to use an enema. General instructions  Ask your health care provider about: ? Changing or stopping your regular medicines or supplements. This is especially important if you are taking iron supplements, diabetes medicines,  or blood thinners. ? Taking medicines such as aspirin and ibuprofen. These medicines can thin your blood. Do not take these medicines before the procedure if your health care provider tells you not to.  Plan to have someone take you home from the hospital or clinic. What happens during the procedure?   An IV may be inserted into one of your veins.  You will be given medicine to help you relax  (sedative).  To reduce your risk of infection: ? Your health care team will wash or sanitize their hands. ? Your anal area will be washed with soap.  You will be asked to lie on your side with your knees bent.  Your health care provider will lubricate a long, thin, flexible tube. The tube will have a camera and a light on the end.  The tube will be inserted into your anus.  The tube will be gently eased through your rectum and colon.  Air will be delivered into your colon to keep it open. You may feel some pressure or cramping.  The camera will be used to take images during the procedure.  A small tissue sample may be removed to be examined under a microscope (biopsy).  If small polyps are found, your health care provider may remove them and have them checked for cancer cells.  When the exam is done, the tube will be removed. The procedure may vary among health care providers and hospitals. What happens after the procedure?  Your blood pressure, heart rate, breathing rate, and blood oxygen level will be monitored until the medicines you were given have worn off.  Do not drive for 24 hours after the exam.  You may have a small amount of blood in your stool.  You may pass gas and have mild abdominal cramping or bloating due to the air that was used to inflate your colon during the exam.  It is up to you to get the results of your procedure. Ask your health care provider, or the department performing the procedure, when your results will be ready. Summary  A colonoscopy is an exam to look at the entire large intestine.  During a colonoscopy, a lubricated, flexible tube with a camera on the end of it is inserted into the anus and then passed into the colon and other parts of the large intestine.  Follow instructions from your health care provider about eating and drinking before the procedure.  If you were prescribed an oral bowel prep to clean out your colon, take it as told  by your health care provider.  After your procedure, your blood pressure, heart rate, breathing rate, and blood oxygen level will be monitored until the medicines you were given have worn off. This information is not intended to replace advice given to you by your health care provider. Make sure you discuss any questions you have with your health care provider. Document Released: 08/22/2000 Document Revised: 06/17/2017 Document Reviewed: 11/06/2015 Elsevier Interactive Patient Education  2019 [employer].

## 2018-11-09 NOTE — Progress Notes (Signed)
Patient ID: Clarence Adams, male   DOB: [birth year], 53 y.o.   MRN: [id number]  Chief Complaint  Patient presents with  . Colonoscopy    HPI Clarence Adams is a 53 y.o. male here today for a evaluation of a screening colonoscopy. He has acid reflex.  Moves his bowels daily.  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   He reports he has a long history of reflux, well controlled with Nexium.  He reports his brother had colon polyps in the past. HPI  Past Medical History:  Diagnosis Date  . Fracture of 5th metatarsal   . GERD (gastroesophageal reflux disease)   . Hyperlipidemia     Past Surgical History:  Procedure Laterality Date  . FOOT SURGERY Right 2016  . NO PAST SURGERIES      Family History  Problem Relation Age of Onset  . Aneurysm Father     Social History Social History   Tobacco Use  . Smoking status: Never Smoker  . Smokeless tobacco: Never Used  Substance Use Topics  . Alcohol use: Yes    Comment: rarely  . Drug use: No    No Known Allergies  Current Outpatient Medications  Medication Sig Dispense Refill  . esomeprazole (NEXIUM) 20 MG capsule Take by mouth.    . rosuvastatin (CRESTOR) 10 MG tablet   5  . selenium sulfide (SELSUN) 2.5 % shampoo   1  . polyethylene glycol powder (GLYCOLAX/MIRALAX) powder Mix whole container with 64 ounces of clear liquids, No Red liquids. 255 g 0   No current facility-administered medications for this visit.     Review of Systems Review of Systems  Constitutional: Negative.   Respiratory: Negative.   Cardiovascular: Negative.     Blood pressure 139/86, pulse 60, temperature 97.8 F (36.6 C), temperature source Skin, height 5\' 11"  (1.803 m), weight 166 lb (75.3 kg), SpO2 99 %.  Physical Exam Physical Exam Eyes:     General: No scleral icterus. Neck:     Musculoskeletal: Normal range of motion and neck supple.  Cardiovascular:     Rate and Rhythm: Normal rate and regular rhythm.   Pulmonary:     Effort: Pulmonary effort is normal.     Breath sounds: Normal breath sounds.  Skin:    General: Skin is dry.  Neurological:     Mental Status: He is alert and oriented to person, place, and time.     Data Reviewed PCP notes of 2019 reviewed.  Feb 03, 2018 laboratory studies reported creatinine of 1.03 with potassium 4.6.  Assessment Candidate for screening colonoscopy.  Long history of gastroesophageal reflux.  Plan  Upper endoscopy was recommended to confirm that the patient does not have any mucosal changes, potentially masked by his ongoing PPI therapy.  He is a non-smoker, and less likely be a potential Barrett's esophagus patient.   Colonoscopy with possible biopsy/polypectomy prn: Information regarding the procedure, including its potential risks and complications (including but not limited to perforation of the bowel, which may require emergency surgery to repair, and bleeding) was verbally given to the patient. Educational information regarding lower intestinal endoscopy was given to the patient. Written instructions for how to complete the bowel prep using Miralax were provided. The importance of drinking ample fluids to avoid dehydration as a result of the prep emphasized.  HPI, Physical Exam, Assessment and Plan have been scribed under the direction and in the presence of Hervey Ard, MD.  Gaspar Cola, CMA  I have completed the exam and reviewed the above documentation for accuracy and completeness.  I agree with the above.  Haematologist has been used and any errors in dictation or transcription are unintentional.  Hervey Ard, M.D., F.A.C.S.   The patient is scheduled for a Colonoscopy at [facility] on 12/08/18. They are aware to call the day before to get their arrival time. He will stop his Fish Oil one week prior. Miralax prescription has been sent into the patient's pharmacy. The patient is aware of date and instructions.  Documented by  Caryl-Lyn Otis Brace LPN  Forest Gleason Destinae Neubecker 11/09/2018, 7:48 PM

## 2018-11-25 ENCOUNTER — Telehealth: Payer: Self-pay | Admitting: *Deleted

## 2018-11-25 NOTE — Telephone Encounter (Signed)
C/x surgery, will call back to schedule

## 2018-12-08 ENCOUNTER — Ambulatory Visit: Admit: 2018-12-08 | Payer: BLUE CROSS/BLUE SHIELD | Admitting: General Surgery

## 2018-12-08 SURGERY — COLONOSCOPY WITH PROPOFOL
Anesthesia: General

## 2019-01-18 ENCOUNTER — Telehealth: Payer: Self-pay | Admitting: *Deleted

## 2019-01-18 NOTE — Telephone Encounter (Signed)
Patient contacted today about rescheduling colonoscopy that was originally scheduled for 12-08-18 but had to be postponed due to COVID-19.   I offered patient to have upper and lower endoscopy completed on 01-26-19.   Patient wishes to check with his wife since she will be the driver and call the office back later this afternoon.

## 2019-01-18 NOTE — Telephone Encounter (Signed)
Patient called the office back and wants to schedule upper and lower endoscopy for 01-26-19.   Patient to go for COVID-19 testing on 01-20-19 between 10:30 am and 12:30 pm at the [facility].  The patient was reminded to stop fish oil one week prior.   Medication list updated.   Patient aware to call the office if he has further questions.

## 2019-01-20 ENCOUNTER — Other Ambulatory Visit: Payer: Self-pay

## 2019-01-20 ENCOUNTER — Other Ambulatory Visit
Admission: RE | Admit: 2019-01-20 | Discharge: 2019-01-20 | Disposition: A | Discharge status: Home / Self Care | Payer: 59 | Source: Ambulatory Visit | Attending: General Surgery | Admitting: General Surgery

## 2019-01-20 DIAGNOSIS — Z1159 Encounter for screening for other viral diseases: Secondary | ICD-10-CM | POA: Insufficient documentation

## 2019-01-21 LAB — NOVEL CORONAVIRUS, NAA (HOSP ORDER, SEND-OUT TO REF LAB; TAT 18-24 HRS): SARS-CoV-2, NAA: NOT DETECTED

## 2019-01-25 ENCOUNTER — Encounter: Payer: Self-pay | Admitting: *Deleted

## 2019-01-26 ENCOUNTER — Ambulatory Visit: Payer: 59 | Admitting: Anesthesiology

## 2019-01-26 ENCOUNTER — Ambulatory Visit
Admission: RE | Admit: 2019-01-26 | Discharge: 2019-01-26 | Disposition: A | Discharge status: Home / Self Care | Payer: 59 | Attending: General Surgery | Admitting: General Surgery

## 2019-01-26 ENCOUNTER — Encounter
Admission: RE | Disposition: A | Discharge status: Home / Self Care | Payer: Self-pay | Source: Home / Self Care | Attending: General Surgery

## 2019-01-26 ENCOUNTER — Encounter: Payer: Self-pay | Admitting: *Deleted

## 2019-01-26 ENCOUNTER — Other Ambulatory Visit: Payer: Self-pay

## 2019-01-26 DIAGNOSIS — K219 Gastro-esophageal reflux disease without esophagitis: Secondary | ICD-10-CM | POA: Diagnosis not present

## 2019-01-26 DIAGNOSIS — Z1211 Encounter for screening for malignant neoplasm of colon: Secondary | ICD-10-CM | POA: Diagnosis not present

## 2019-01-26 DIAGNOSIS — E785 Hyperlipidemia, unspecified: Secondary | ICD-10-CM | POA: Insufficient documentation

## 2019-01-26 DIAGNOSIS — R12 Heartburn: Secondary | ICD-10-CM

## 2019-01-26 DIAGNOSIS — K449 Diaphragmatic hernia without obstruction or gangrene: Secondary | ICD-10-CM | POA: Insufficient documentation

## 2019-01-26 DIAGNOSIS — D127 Benign neoplasm of rectosigmoid junction: Secondary | ICD-10-CM | POA: Insufficient documentation

## 2019-01-26 DIAGNOSIS — D120 Benign neoplasm of cecum: Secondary | ICD-10-CM | POA: Diagnosis not present

## 2019-01-26 DIAGNOSIS — Z79899 Other long term (current) drug therapy: Secondary | ICD-10-CM | POA: Diagnosis not present

## 2019-01-26 DIAGNOSIS — D123 Benign neoplasm of transverse colon: Secondary | ICD-10-CM | POA: Diagnosis not present

## 2019-01-26 DIAGNOSIS — K635 Polyp of colon: Secondary | ICD-10-CM

## 2019-01-26 HISTORY — PX: ESOPHAGOGASTRODUODENOSCOPY (EGD) WITH PROPOFOL: SHX5813

## 2019-01-26 HISTORY — PX: COLONOSCOPY WITH PROPOFOL: SHX5780

## 2019-01-26 SURGERY — COLONOSCOPY WITH PROPOFOL
Anesthesia: General

## 2019-01-26 MED ORDER — MIDAZOLAM HCL 5 MG/5ML IJ SOLN
INTRAMUSCULAR | Status: DC | PRN
  Administered 2019-01-26: 2 mg via INTRAVENOUS

## 2019-01-26 MED ORDER — GLYCOPYRROLATE 0.2 MG/ML IJ SOLN
INTRAMUSCULAR | Status: DC | PRN
  Administered 2019-01-26: 0.2 mg via INTRAVENOUS

## 2019-01-26 MED ORDER — SODIUM CHLORIDE 0.9 % IV SOLN
INTRAVENOUS | Status: DC
  Administered 2019-01-26: 09:00:00 via INTRAVENOUS

## 2019-01-26 MED ORDER — PROPOFOL 10 MG/ML IV BOLUS
INTRAVENOUS | Status: DC | PRN
  Administered 2019-01-26: 30 mg via INTRAVENOUS
  Administered 2019-01-26: 70 mg via INTRAVENOUS
  Administered 2019-01-26: 50 mg via INTRAVENOUS

## 2019-01-26 MED ORDER — LIDOCAINE 2% (20 MG/ML) 5 ML SYRINGE
INTRAMUSCULAR | Status: DC | PRN
  Administered 2019-01-26: 25 mg via INTRAVENOUS

## 2019-01-26 MED ORDER — MIDAZOLAM HCL 2 MG/2ML IJ SOLN
INTRAMUSCULAR | Status: AC
  Filled 2019-01-26: qty 2

## 2019-01-26 MED ORDER — PROPOFOL 500 MG/50ML IV EMUL
INTRAVENOUS | Status: DC | PRN
  Administered 2019-01-26: 120 ug/kg/min via INTRAVENOUS

## 2019-01-26 MED ORDER — FENTANYL CITRATE (PF) 100 MCG/2ML IJ SOLN
INTRAMUSCULAR | Status: AC
  Filled 2019-01-26: qty 2

## 2019-01-26 MED ORDER — FENTANYL CITRATE (PF) 100 MCG/2ML IJ SOLN
INTRAMUSCULAR | Status: DC | PRN
  Administered 2019-01-26 (×2): 50 ug via INTRAVENOUS

## 2019-01-26 NOTE — Op Note (Signed)
[facility] Patient Name: Clarence Adams Procedure Date: 01/26/2019 9:33 AM MRN: [id number] Account #: [id number] Date of Birth: 10-Jan-1966 Admit Type: Outpatient Age: 53 Room: [facility] ENDO ROOM 4 Gender: Male Note Status: Finalized Procedure:            Colonoscopy Indications:          Screening for colorectal malignant neoplasm Providers:            Robert Bellow, MD Medicines:            Monitored Anesthesia Care Complications:        No immediate complications. Procedure:            Pre-Anesthesia Assessment:                       - Prior to the procedure, a History and Physical was                        performed, and patient medications, allergies and                        sensitivities were reviewed. The patient's tolerance of                        previous anesthesia was reviewed.                       - The risks and benefits of the procedure and the                        sedation options and risks were discussed with the                        patient. All questions were answered and informed                        consent was obtained.                       After obtaining informed consent, the colonoscope was                        passed under direct vision. Throughout the procedure,                        the patient's blood pressure, pulse, and oxygen                        saturations were monitored continuously. The                        Colonoscope was introduced through the anus and                        advanced to the the cecum, identified by appendiceal                        orifice and ileocecal valve. The colonoscopy was                        performed without difficulty. The patient tolerated the  procedure well. The quality of the bowel preparation                        was excellent. Findings:      Two sessile polyps were found in the transverse colon, distal transverse       colon and  cecum. The polyps were 8 mm in size. These polyps were removed       with a cold biopsy forceps. Resection and retrieval were complete.      A 15 mm polyp was found in the recto-sigmoid colon. The polyp was       semi-sessile. The polyp was removed with a hot snare. The polyp was       removed with a piecemeal technique using a hot snare. Resection and       retrieval were complete. Excellent hemostasis/      The retroflexed view of the distal rectum and anal verge was normal and       showed no anal or rectal abnormalities. Impression:           - Two 8 mm polyps in the transverse colon, in the                        distal transverse colon and in the cecum, removed with                        a cold biopsy forceps. Resected and retrieved.                       - One 15 mm polyp at the recto-sigmoid colon, removed                        with a hot snare and removed piecemeal using a hot                        snare. Resected and retrieved.                       - The distal rectum and anal verge are normal on                        retroflexion view. Recommendation:       - Telephone endoscopist for pathology results in 1 week. Procedure Code(s):    --- Professional ---                       [phone], Colonoscopy, flexible; with removal of tumor(s),                        polyp(s), or other lesion(s) by snare technique                       [id number], 36, Colonoscopy, flexible; with biopsy, single                        or multiple Diagnosis Code(s):    --- Professional ---                       K63.5, Polyp of colon  Z12.11, Encounter for screening for malignant neoplasm                        of colon CPT copyright 2019 American Medical Association. All rights reserved. The codes documented in this report are preliminary and upon coder review may  be revised to meet current compliance requirements. Robert Bellow, MD 01/26/2019 10:36:03 AM This report has been  signed electronically. Number of Addenda: 0 Note Initiated On: 01/26/2019 9:33 AM Scope Withdrawal Time: 0 hours 28 minutes 20 seconds  Total Procedure Duration: 0 hours 32 minutes 32 seconds       [facility]

## 2019-01-26 NOTE — Anesthesia Preprocedure Evaluation (Signed)
Anesthesia Evaluation  Patient identified by MRN, date of birth, ID band Patient awake    Reviewed: Allergy & Precautions, NPO status , Patient's Chart, lab work & pertinent test results  Airway Mallampati: II  TM Distance: >3 FB     Dental   Pulmonary neg pulmonary ROS,    Pulmonary exam normal        Cardiovascular negative cardio ROS Normal cardiovascular exam     Neuro/Psych negative neurological ROS  negative psych ROS   GI/Hepatic Neg liver ROS, GERD  ,  Endo/Other  negative endocrine ROS  Renal/GU negative Renal ROS  negative genitourinary   Musculoskeletal negative musculoskeletal ROS (+)   Abdominal Normal abdominal exam  (+)   Peds negative pediatric ROS (+)  Hematology negative hematology ROS (+)   Anesthesia Other Findings Past Medical History: No date: Fracture of 5th metatarsal No date: GERD (gastroesophageal reflux disease) No date: Hyperlipidemia  Reproductive/Obstetrics                             Anesthesia Physical Anesthesia Plan  ASA: II  Anesthesia Plan: General   Post-op Pain Management:    Induction: Intravenous  PONV Risk Score and Plan: Propofol infusion  Airway Management Planned: Nasal Cannula  Additional Equipment:   Intra-op Plan:   Post-operative Plan:   Informed Consent: I have reviewed the patients History and Physical, chart, labs and discussed the procedure including the risks, benefits and alternatives for the proposed anesthesia with the patient or authorized representative who has indicated his/her understanding and acceptance.     Dental advisory given  Plan Discussed with: CRNA and Surgeon  Anesthesia Plan Comments:         Anesthesia Quick Evaluation

## 2019-01-26 NOTE — Op Note (Signed)
[facility] Patient Name: Clarence Adams Procedure Date: 01/26/2019 9:33 AM MRN: [id number] Account #: [id number] Date of Birth: Nov 19, 1965 Admit Type: Outpatient Age: 53 Room: [facility] ENDO ROOM 4 Gender: Male Note Status: Finalized Procedure:            Upper GI endoscopy Indications:          Heartburn Providers:            Robert Bellow, MD Referring MD:         Leona Carry. Hall Busing, MD (Referring MD) Medicines:            Monitored Anesthesia Care Complications:        No immediate complications. Procedure:            Pre-Anesthesia Assessment:                       - Prior to the procedure, a History and Physical was                        performed, and patient medications, allergies and                        sensitivities were reviewed. The patient's tolerance of                        previous anesthesia was reviewed.                       - The risks and benefits of the procedure and the                        sedation options and risks were discussed with the                        patient. All questions were answered and informed                        consent was obtained.                       After obtaining informed consent, the endoscope was                        passed under direct vision. Throughout the procedure,                        the patient's blood pressure, pulse, and oxygen                        saturations were monitored continuously. The Endoscope                        was introduced through the mouth, and advanced to the                        second part of duodenum. The upper GI endoscopy was                        accomplished without difficulty. The patient tolerated  the procedure well. Findings:      A small hiatal hernia was present.      The stomach was normal.      The examined duodenum was normal. Impression:           - Small hiatal hernia.                       - Normal stomach.                    - Normal examined duodenum.                       - No specimens collected. Recommendation:       - Perform a colonoscopy today. Procedure Code(s):    --- Professional ---                       [phone], Esophagogastroduodenoscopy, flexible, transoral;                        diagnostic, including collection of specimen(s) by                        brushing or washing, when performed (separate procedure) Diagnosis Code(s):    --- Professional ---                       K44.9, Diaphragmatic hernia without obstruction or                        gangrene                       R12, Heartburn CPT copyright 2019 American Medical Association. All rights reserved. The codes documented in this report are preliminary and upon coder review may  be revised to meet current compliance requirements. Robert Bellow, MD 01/26/2019 9:56:27 AM This report has been signed electronically. Number of Addenda: 0 Note Initiated On: 01/26/2019 9:33 AM      [facility]

## 2019-01-26 NOTE — Anesthesia Postprocedure Evaluation (Signed)
Anesthesia Post Note  Patient: Clarence Adams  Procedure(s) Performed: COLONOSCOPY WITH PROPOFOL (N/A ) ESOPHAGOGASTRODUODENOSCOPY (EGD) WITH PROPOFOL (N/A )  Patient location during evaluation: Endoscopy Anesthesia Type: General Level of consciousness: awake and alert and oriented Pain management: pain level controlled Vital Signs Assessment: post-procedure vital signs reviewed and stable Respiratory status: spontaneous breathing Cardiovascular status: blood pressure returned to baseline Anesthetic complications: no     Last Vitals:  Vitals:   01/26/19 1059 01/26/19 1104  BP: (!) 130/97 (!) 122/93  Pulse: 71 66  Resp: 19 13  Temp:    SpO2: 98% 97%    Last Pain:  Vitals:   01/26/19 1104  TempSrc:   PainSc: 0-No pain                 Ivon Oelkers

## 2019-01-26 NOTE — Transfer of Care (Signed)
Immediate Anesthesia Transfer of Care Note  Patient: Clarence Adams  Procedure(s) Performed: COLONOSCOPY WITH PROPOFOL (N/A ) ESOPHAGOGASTRODUODENOSCOPY (EGD) WITH PROPOFOL (N/A )  Patient Location: Endoscopy Unit  Anesthesia Type:General  Level of Consciousness: awake  Airway & Oxygen Therapy: Patient Spontanous Breathing  Post-op Assessment: Report given to RN and Post -op Vital signs reviewed and stable  Post vital signs: Reviewed  Last Vitals:  Vitals Value Taken Time  BP 107/93 01/26/2019 10:37 AM  Temp 36.1 C 01/26/2019 10:37 AM  Pulse 70 01/26/2019 10:37 AM  Resp 15 01/26/2019 10:37 AM  SpO2 97 % 01/26/2019 10:37 AM    Last Pain:  Vitals:   01/26/19 1034  TempSrc: Tympanic  PainSc: 0-No pain         Complications: No apparent anesthesia complications

## 2019-01-26 NOTE — Anesthesia Post-op Follow-up Note (Signed)
Anesthesia QCDR form completed.        

## 2019-01-26 NOTE — H&P (Signed)
Clarence Adams [id number] 09-26-1965     HPI:  Patient for screening colonoscopy with long history of reflux. For EGD and colon exam. Tolerated prep well.   Medications Prior to Admission  Medication Sig Dispense Refill Last Dose  . esomeprazole (NEXIUM) 20 MG capsule Take by mouth.   01/25/2019 at Unknown time  . Multiple Vitamin (MULTIVITAMIN) tablet Take 1 tablet by mouth daily.   Past Week at Unknown time  . rosuvastatin (CRESTOR) 10 MG tablet   5 01/25/2019 at Unknown time  . polyethylene glycol powder (GLYCOLAX/MIRALAX) powder Mix whole container with 64 ounces of clear liquids, No Red liquids. 255 g 0   . selenium sulfide (SELSUN) 2.5 % shampoo   1 Taking   No Known Allergies Past Medical History:  Diagnosis Date  . Fracture of 5th metatarsal   . GERD (gastroesophageal reflux disease)   . Hyperlipidemia    Past Surgical History:  Procedure Laterality Date  . FOOT SURGERY Right 2016  . NO PAST SURGERIES     Social History   Socioeconomic History  . Marital status: Married    Spouse name: Not on file  . Number of children: Not on file  . Years of education: Not on file  . Highest education level: Not on file  Occupational History  . Not on file  Social Needs  . Financial resource strain: Not on file  . Food insecurity:    Worry: Not on file    Inability: Not on file  . Transportation needs:    Medical: Not on file    Non-medical: Not on file  Tobacco Use  . Smoking status: Never Smoker  . Smokeless tobacco: Never Used  Substance and Sexual Activity  . Alcohol use: Yes    Comment: rarely  . Drug use: No  . Sexual activity: Not on file  Lifestyle  . Physical activity:    Days per week: Not on file    Minutes per session: Not on file  . Stress: Not on file  Relationships  . Social connections:    Talks on phone: Not on file    Gets together: Not on file    Attends religious service: Not on file    Active member of club or organization: Not on file   Attends meetings of clubs or organizations: Not on file    Relationship status: Not on file  . Intimate partner violence:    Fear of current or ex partner: Not on file    Emotionally abused: Not on file    Physically abused: Not on file    Forced sexual activity: Not on file  Other Topics Concern  . Not on file  Social History Narrative  . Not on file   Social History   Social History Narrative  . Not on file     ROS: Negative.     PE: HEENT: Negative. Lungs: Clear. Cardio: RR.   Assessment/Plan:  Proceed with planned endoscopy.  Forest Gleason Jermall Isaacson 01/26/2019

## 2019-01-27 ENCOUNTER — Encounter: Payer: Self-pay | Admitting: General Surgery

## 2019-01-27 ENCOUNTER — Telehealth: Payer: Self-pay

## 2019-01-27 LAB — SURGICAL PATHOLOGY

## 2019-01-27 NOTE — Telephone Encounter (Signed)
-----   Message from Robert Bellow, MD sent at 01/27/2019 10:39 AM EDT ----- Please send a copy to Dr. Hall Busing.  Please notify the patient pathology was benign, but because he had three polyps, a repeat exam in three years is recommended.   Place in recalls for a colonoscopy in three years. Thanks.

## 2019-01-27 NOTE — Telephone Encounter (Signed)
Notified patient as instructed, patient pleased. Discussed follow-up appointments, patient agrees. Patient placed in recalls.   

## 2019-05-09 ENCOUNTER — Other Ambulatory Visit: Payer: Self-pay

## 2019-05-09 DIAGNOSIS — Z20822 Contact with and (suspected) exposure to covid-19: Secondary | ICD-10-CM

## 2019-05-11 ENCOUNTER — Telehealth: Payer: Self-pay | Admitting: Internal Medicine

## 2019-05-11 LAB — NOVEL CORONAVIRUS, NAA: SARS-CoV-2, NAA: NOT DETECTED

## 2019-05-11 NOTE — Telephone Encounter (Signed)
Negative COVID results given. Patient results "NOT Detected." Caller expressed understanding. ° °

## 2020-09-11 ENCOUNTER — Other Ambulatory Visit: Payer: Self-pay

## 2020-09-11 ENCOUNTER — Encounter: Payer: Self-pay | Admitting: Internal Medicine

## 2020-09-11 ENCOUNTER — Ambulatory Visit: Payer: 59 | Admitting: Internal Medicine

## 2020-09-11 VITALS — BP 122/70 | HR 60 | Ht 71.0 in | Wt 171.0 lb

## 2020-09-11 DIAGNOSIS — E782 Mixed hyperlipidemia: Secondary | ICD-10-CM

## 2020-09-11 DIAGNOSIS — B360 Pityriasis versicolor: Secondary | ICD-10-CM

## 2020-09-11 DIAGNOSIS — D171 Benign lipomatous neoplasm of skin and subcutaneous tissue of trunk: Secondary | ICD-10-CM | POA: Insufficient documentation

## 2020-09-11 DIAGNOSIS — Z86010 Personal history of colonic polyps: Secondary | ICD-10-CM | POA: Insufficient documentation

## 2020-09-11 DIAGNOSIS — K219 Gastro-esophageal reflux disease without esophagitis: Secondary | ICD-10-CM

## 2020-09-11 NOTE — Progress Notes (Signed)
Date:  09/11/2020   Name:  Clarence Adams   DOB:  30-Jan-1966   MRN:  [id number]  Previous pt of Dr. Arlana Pouch.  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  Chief Complaint: Establish Care (New Patient.) and Mass (Right upper abd mass. Growing in size. Been there for a few years. They are saying in the past it was a fatty deposit but its becoming larger. Could not see on colonoscopy 2 years ago. )  Hyperlipidemia This is a chronic problem. The problem is controlled. Pertinent negatives include no chest pain. Current antihyperlipidemic treatment includes statins. The current treatment provides significant improvement of lipids.  Gastroesophageal Reflux He complains of heartburn. He reports no abdominal pain, no chest pain, no choking, no coughing, no dysphagia or no wheezing. This is a recurrent problem. The problem occurs occasionally. The heartburn does not wake him from sleep. Pertinent negatives include no fatigue. He has tried a PPI for the symptoms. The treatment provided significant relief.  Abd wall mass - has been present for several years, may be slowly enlarging.  No pain or tenderness.  Does not interfere with any activities.  No results found for: CREATININE, BUN, NA, K, CL, CO2 No results found for: CHOL, HDL, LDLCALC, LDLDIRECT, TRIG, CHOLHDL No results found for: TSH No results found for: HGBA1C No results found for: WBC, HGB, HCT, MCV, PLT No results found for: ALT, AST, GGT, ALKPHOS, BILITOT   Review of Systems  Constitutional: Negative for chills, fatigue and fever.  HENT: Negative for trouble swallowing.   Eyes: Negative for visual disturbance.  Respiratory: Negative for cough, choking and wheezing.   Cardiovascular: Negative for chest pain, palpitations and leg swelling.  Gastrointestinal: Positive for heartburn. Negative for abdominal pain, constipation, diarrhea and dysphagia.  Musculoskeletal:  Negative for arthralgias and gait problem.  Skin: Positive for color change (chronic TV).  Neurological: Negative for dizziness, light-headedness and headaches.  Psychiatric/Behavioral: Negative for dysphoric mood and sleep disturbance. The patient is not nervous/anxious.     Patient Active Problem List   Diagnosis Date Noted  . Hx of adenomatous colonic polyps 09/11/2020  . Tinea versicolor 09/11/2020  . Mixed hyperlipidemia 09/11/2020  . Lipoma of torso 09/11/2020  . Encounter for screening colonoscopy 11/09/2018  . GERD without esophagitis 11/09/2018    No Known Allergies  Past Surgical History:  Procedure Laterality Date  . COLONOSCOPY WITH PROPOFOL N/A 01/26/2019   Procedure: COLONOSCOPY WITH PROPOFOL;  Surgeon: Earline Mayotte, MD;  Location: [facility] ENDOSCOPY;  Service: Endoscopy;  Laterality: N/A;  . ESOPHAGOGASTRODUODENOSCOPY (EGD) WITH PROPOFOL N/A 01/26/2019   Procedure: ESOPHAGOGASTRODUODENOSCOPY (EGD) WITH PROPOFOL;  Surgeon: Earline Mayotte, MD;  Location: [facility] ENDOSCOPY;  Service: Endoscopy;  Laterality: N/A;  . FOOT SURGERY Right 2016  . NO PAST SURGERIES      Social History   Tobacco Use  . Smoking status: Never Smoker  . Smokeless tobacco: Never Used  Vaping Use  . Vaping Use: Never used  Substance Use Topics  . Alcohol use: Yes    Alcohol/week: 1.0 standard drink    Types: 1 Standard drinks or equivalent per week    Comment: rarely  . Drug use: No     Medication list has been reviewed and updated.  Current Meds  Medication Sig  . esomeprazole (NEXIUM) 20 MG capsule Take 40 mg by mouth daily at 2 PM.  . Multiple Vitamin (  MULTIVITAMIN) tablet Take 1 tablet by mouth daily.  . rosuvastatin (CRESTOR) 10 MG tablet Take 20 mg by mouth daily.  Marland Kitchen selenium sulfide (SELSUN) 2.5 % shampoo     PHQ 2/9 Scores 09/11/2020  PHQ - 2 Score 0  PHQ- 9 Score 0    GAD 7 : Generalized Anxiety Score 09/11/2020  Nervous, Anxious, on Edge 0  Control/stop worrying 0   Worry too much - different things 0  Trouble relaxing 0  Restless 0  Easily annoyed or irritable 0  Afraid - awful might happen 0  Total GAD 7 Score 0  Anxiety Difficulty Not difficult at all    BP Readings from Last 3 Encounters:  09/11/20 122/70  01/26/19 (!) 122/93  11/09/18 139/86    Physical Exam Vitals and nursing note reviewed.  Constitutional:      General: He is not in acute distress.    Appearance: He is well-developed.  HENT:     Head: Normocephalic and atraumatic.  Neck:     Vascular: No carotid bruit.  Cardiovascular:     Rate and Rhythm: Normal rate and regular rhythm.     Pulses: Normal pulses.     Heart sounds: No murmur heard.   Pulmonary:     Effort: Pulmonary effort is normal. No respiratory distress.     Breath sounds: No wheezing or rhonchi.  Abdominal:     General: Abdomen is flat. Bowel sounds are normal.     Palpations: Abdomen is soft.     Tenderness: There is no abdominal tenderness.       Comments: Smooth soft non-tender mobile mass  Musculoskeletal:     Cervical back: Normal range of motion.     Right lower leg: No edema.     Left lower leg: No edema.  Lymphadenopathy:     Cervical: No cervical adenopathy.  Skin:    General: Skin is warm and dry.     Findings: Rash present. Rash is macular.     Comments: Scattered circular lesions on forearms  Neurological:     General: No focal deficit present.     Mental Status: He is alert and oriented to person, place, and time.  Psychiatric:        Attention and Perception: Attention normal.        Mood and Affect: Mood and affect and mood normal.     Wt Readings from Last 3 Encounters:  09/11/20 171 lb (77.6 kg)  01/26/19 165 lb (74.8 kg)  11/09/18 166 lb (75.3 kg)    BP 122/70   Pulse 60   Ht 5\' 11"  (1.803 m)   Wt 171 lb (77.6 kg)   SpO2 98%   BMI 23.85 kg/m   Assessment and Plan: 1. Lipoma of torso Patient is reassured that is almost certainly benign If it is enlarging  we can refer to General surgery  2. Tinea versicolor Continue topical treatment  3. Mixed hyperlipidemia On Crestor No recent labs available - pt tolerating medication without side effects  4. GERD without esophagitis Symptoms well controlled on daily PPI No red flag signs such as weight loss, n/v, melena Will continue omeprazole 40 mg daily.  Return for CPX Partially dictated using Editor, commissioning. Any errors are unintentional.  Halina Maidens, MD [facility]  09/11/2020

## 2020-10-08 ENCOUNTER — Ambulatory Visit (INDEPENDENT_AMBULATORY_CARE_PROVIDER_SITE_OTHER): Payer: 59 | Admitting: Internal Medicine

## 2020-10-08 ENCOUNTER — Encounter: Payer: Self-pay | Admitting: Internal Medicine

## 2020-10-08 ENCOUNTER — Other Ambulatory Visit: Payer: Self-pay

## 2020-10-08 VITALS — BP 124/86 | HR 57 | Temp 97.6°F | Ht 71.0 in | Wt 170.0 lb

## 2020-10-08 DIAGNOSIS — Z1159 Encounter for screening for other viral diseases: Secondary | ICD-10-CM | POA: Diagnosis not present

## 2020-10-08 DIAGNOSIS — K219 Gastro-esophageal reflux disease without esophagitis: Secondary | ICD-10-CM | POA: Diagnosis not present

## 2020-10-08 DIAGNOSIS — E782 Mixed hyperlipidemia: Secondary | ICD-10-CM | POA: Diagnosis not present

## 2020-10-08 DIAGNOSIS — H60501 Unspecified acute noninfective otitis externa, right ear: Secondary | ICD-10-CM

## 2020-10-08 DIAGNOSIS — Z125 Encounter for screening for malignant neoplasm of prostate: Secondary | ICD-10-CM

## 2020-10-08 DIAGNOSIS — Z0000 Encounter for general adult medical examination without abnormal findings: Secondary | ICD-10-CM | POA: Diagnosis not present

## 2020-10-08 DIAGNOSIS — Z23 Encounter for immunization: Secondary | ICD-10-CM | POA: Diagnosis not present

## 2020-10-08 MED ORDER — ROSUVASTATIN CALCIUM 10 MG PO TABS: 20.0000 mg | ORAL_TABLET | Freq: Every day | ORAL | 5 refills | Status: DC

## 2020-10-08 MED ORDER — NEOMYCIN-POLYMYXIN-HC 3.5-10000-1 OT SOLN: 3.0000 [drp] | Freq: Three times a day (TID) | OTIC | 0 refills | Status: DC

## 2020-10-08 NOTE — Progress Notes (Signed)
Date:  10/08/2020   Name:  Clarence Adams   DOB:  [birth year]   MRN:  [id number]   Chief Complaint: Annual Exam  Clarence Adams is a 55 y.o. male who presents today for his Complete Annual Exam. He feels well. He reports exercising none. He reports he is sleeping well.   Colonoscopy: 01/2019  Immunization History  Administered Date(s) Administered  . Moderna Sars-Covid-2 Vaccination 08/23/2020  . PFIZER(Purple Top)SARS-COV-2 Vaccination 12/27/2019, 01/20/2020  Declines Shingrix due to no hx of chicken pox  Gastroesophageal Reflux He complains of heartburn. He reports no abdominal pain, no chest pain, no choking or no wheezing. This is a recurrent problem. The problem occurs rarely. The problem has been unchanged. Pertinent negatives include no fatigue. He has tried a PPI for the symptoms. The treatment provided significant relief.  Hyperlipidemia This is a chronic problem. The problem is controlled. Recent lipid tests were reviewed and are normal. Pertinent negatives include no chest pain, focal weakness, myalgias or shortness of breath. Current antihyperlipidemic treatment includes statins. The current treatment provides moderate improvement of lipids.    No results found for: CREATININE, BUN, NA, K, CL, CO2 No results found for: CHOL, HDL, LDLCALC, LDLDIRECT, TRIG, CHOLHDL No results found for: TSH No results found for: HGBA1C No results found for: WBC, HGB, HCT, MCV, PLT No results found for: ALT, AST, GGT, ALKPHOS, BILITOT   Review of Systems  Constitutional: Negative for appetite change, chills, diaphoresis, fatigue and unexpected weight change.  HENT: Positive for ear pain. Negative for hearing loss, tinnitus, trouble swallowing and voice change.   Eyes: Negative for visual disturbance.  Respiratory: Negative for choking, shortness of breath and wheezing.   Cardiovascular: Negative for chest pain, palpitations and leg swelling.  Gastrointestinal: Positive for heartburn.  Negative for abdominal pain, blood in stool, constipation and diarrhea.  Genitourinary: Negative for difficulty urinating, dysuria and frequency.  Musculoskeletal: Negative for arthralgias, back pain and myalgias.  Skin: Negative for color change and rash.  Allergic/Immunologic: Negative for environmental allergies.  Neurological: Negative for dizziness, focal weakness, syncope and headaches.  Hematological: Negative for adenopathy.  Psychiatric/Behavioral: Negative for dysphoric mood and sleep disturbance.    Patient Active Problem List   Diagnosis Date Noted  . Hx of adenomatous colonic polyps 09/11/2020  . Tinea versicolor 09/11/2020  . Mixed hyperlipidemia 09/11/2020  . Lipoma of torso 09/11/2020  . Encounter for screening colonoscopy 11/09/2018  . GERD without esophagitis 11/09/2018    No Known Allergies  Past Surgical History:  Procedure Laterality Date  . COLONOSCOPY WITH PROPOFOL N/A 01/26/2019   Procedure: COLONOSCOPY WITH PROPOFOL;  Surgeon: Robert Bellow, MD;  Location: [facility] ENDOSCOPY;  Service: Endoscopy;  Laterality: N/A;  . ESOPHAGOGASTRODUODENOSCOPY (EGD) WITH PROPOFOL N/A 01/26/2019   Procedure: ESOPHAGOGASTRODUODENOSCOPY (EGD) WITH PROPOFOL;  Surgeon: Robert Bellow, MD;  Location: [facility] ENDOSCOPY;  Service: Endoscopy;  Laterality: N/A;  . FOOT SURGERY Right 2016  . NO PAST SURGERIES      Social History   Tobacco Use  . Smoking status: Never Smoker  . Smokeless tobacco: Never Used  Vaping Use  . Vaping Use: Never used  Substance Use Topics  . Alcohol use: Yes    Alcohol/week: 1.0 standard drink    Types: 1 Standard drinks or equivalent per week    Comment: rarely  . Drug use: No     Medication list has been reviewed and updated.  Current Meds  Medication Sig  . CALCIUM PO Take by  mouth daily.  Marland Kitchen esomeprazole (NEXIUM) 40 MG capsule Take 40 mg by mouth daily.  . Multiple Vitamin (MULTIVITAMIN) tablet Take 1 tablet by mouth daily.  Marland Kitchen  neomycin-polymyxin-hydrocortisone (CORTISPORIN) OTIC solution Place 3 drops into the right ear 3 (three) times daily.  Marland Kitchen selenium sulfide (SELSUN) 2.5 % shampoo daily as needed.  . [DISCONTINUED] rosuvastatin (CRESTOR) 10 MG tablet Take 20 mg by mouth daily.    PHQ 2/9 Scores 10/08/2020 09/11/2020  PHQ - 2 Score 0 0  PHQ- 9 Score 0 0    GAD 7 : Generalized Anxiety Score 10/08/2020 09/11/2020  Nervous, Anxious, on Edge 0 0  Control/stop worrying 0 0  Worry too much - different things 0 0  Trouble relaxing 0 0  Restless 0 0  Easily annoyed or irritable 0 0  Afraid - awful might happen 0 0  Total GAD 7 Score 0 0  Anxiety Difficulty - Not difficult at all    BP Readings from Last 3 Encounters:  10/08/20 124/86  09/11/20 122/70  01/26/19 (!) 122/93    Physical Exam Vitals and nursing note reviewed.  Constitutional:      Appearance: Normal appearance. He is well-developed.  HENT:     Head: Normocephalic.     Right Ear: Ear canal and external ear normal. Tympanic membrane is erythematous and retracted.     Left Ear: Tympanic membrane, ear canal and external ear normal. Tympanic membrane is not erythematous or retracted.     Nose: Nose normal.  Eyes:     Conjunctiva/sclera: Conjunctivae normal.     Pupils: Pupils are equal, round, and reactive to light.  Neck:     Thyroid: No thyromegaly.     Vascular: No carotid bruit.  Cardiovascular:     Rate and Rhythm: Normal rate and regular rhythm.     Heart sounds: Normal heart sounds.  Pulmonary:     Effort: Pulmonary effort is normal.     Breath sounds: Normal breath sounds. No wheezing.  Chest:  Breasts:     Right: No mass.     Left: No mass.    Abdominal:     General: Abdomen is flat. Bowel sounds are normal.     Palpations: Abdomen is soft.     Tenderness: There is no abdominal tenderness.  Musculoskeletal:        General: Normal range of motion.     Cervical back: Normal range of motion and neck supple.  Lymphadenopathy:      Cervical: No cervical adenopathy.  Skin:    General: Skin is warm and dry.  Neurological:     Mental Status: He is alert and oriented to person, place, and time.     Sensory: Sensation is intact.     Motor: Motor function is intact.     Deep Tendon Reflexes: Reflexes are normal and symmetric.  Psychiatric:        Attention and Perception: Attention normal.        Mood and Affect: Mood normal.        Thought Content: Thought content normal.     Wt Readings from Last 3 Encounters:  10/08/20 170 lb (77.1 kg)  09/11/20 171 lb (77.6 kg)  01/26/19 165 lb (74.8 kg)    BP 124/86   Pulse (!) 57   Temp 97.6 F (36.4 C) (Oral)   Ht 5\' 11"  (1.803 m)   Wt 170 lb (77.1 kg)   SpO2 97%   BMI 23.71 kg/m   Assessment  and Plan: 1. Annual physical exam Normal exam Continue healthy diet, exercise  2. Prostate cancer screening DRE deferred to lack of sx - PSA  3. Need for hepatitis C screening test - Hepatitis C antibody  4. Mixed hyperlipidemia Tolerating statin medication without side effects at this time LDL is at goal of < 70 on current dose Continue same therapy without change at this time. - Comprehensive metabolic panel - Lipid panel - rosuvastatin (CRESTOR) 10 MG tablet; Take 2 tablets (20 mg total) by mouth daily.  Dispense: 30 tablet; Refill: 5  5. GERD without esophagitis Symptoms well controlled on daily PPI No red flag signs such as weight loss, n/v, melena Will continue nexium bid. - CBC with Differential/Platelet  6. Need for diphtheria-tetanus-pertussis (Tdap) vaccine - Tdap vaccine greater than or equal to 7yo IM  7. Acute otitis externa of right ear, unspecified type - neomycin-polymyxin-hydrocortisone (CORTISPORIN) OTIC solution; Place 3 drops into the right ear 3 (three) times daily.  Dispense: 10 mL; Refill: 0   Partially dictated using Editor, commissioning. Any errors are unintentional.  Halina Maidens, MD [facility]  10/08/2020

## 2020-10-08 NOTE — Patient Instructions (Signed)

## 2020-10-09 LAB — COMPREHENSIVE METABOLIC PANEL
ALT: 28 IU/L (ref 0–44)
AST: 21 IU/L (ref 0–40)
Albumin/Globulin Ratio: 2.4 — ABNORMAL HIGH (ref 1.2–2.2)
Albumin: 4.8 g/dL (ref 3.8–4.9)
Alkaline Phosphatase: 70 IU/L (ref 44–121)
BUN/Creatinine Ratio: 13 (ref 9–20)
BUN: 14 mg/dL (ref 6–24)
Bilirubin Total: 0.6 mg/dL (ref 0.0–1.2)
CO2: 23 mmol/L (ref 20–29)
Calcium: 9.8 mg/dL (ref 8.7–10.2)
Chloride: 102 mmol/L (ref 96–106)
Creatinine, Ser: 1.05 mg/dL (ref 0.76–1.27)
GFR calc Af Amer: 94 mL/min/{1.73_m2} (ref >59)
GFR calc non Af Amer: 81 mL/min/{1.73_m2} (ref >59)
Globulin, Total: 2 g/dL (ref 1.5–4.5)
Glucose: 77 mg/dL (ref 65–99)
Potassium: 4.3 mmol/L (ref 3.5–5.2)
Sodium: 140 mmol/L (ref 134–144)
Total Protein: 6.8 g/dL (ref 6.0–8.5)

## 2020-10-09 LAB — LIPID PANEL
Chol/HDL Ratio: 4.1 ratio (ref 0.0–5.0)
Cholesterol, Total: 204 mg/dL — ABNORMAL HIGH (ref 100–199)
HDL: 50 mg/dL (ref >39)
LDL Chol Calc (NIH): 128 mg/dL — ABNORMAL HIGH (ref 0–99)
Triglycerides: 149 mg/dL (ref 0–149)
VLDL Cholesterol Cal: 26 mg/dL (ref 5–40)

## 2020-10-09 LAB — CBC WITH DIFFERENTIAL/PLATELET
Basophils Absolute: 0.1 10*3/uL (ref 0.0–0.2)
Basos: 1 %
EOS (ABSOLUTE): 0.2 10*3/uL (ref 0.0–0.4)
Eos: 2 %
Hematocrit: 44.2 % (ref 37.5–51.0)
Hemoglobin: 14.7 g/dL (ref 13.0–17.7)
Immature Grans (Abs): 0 10*3/uL (ref 0.0–0.1)
Immature Granulocytes: 0 %
Lymphocytes Absolute: 2.6 10*3/uL (ref 0.7–3.1)
Lymphs: 31 %
MCH: 30.2 pg (ref 26.6–33.0)
MCHC: 33.3 g/dL (ref 31.5–35.7)
MCV: 91 fL (ref 79–97)
Monocytes Absolute: 0.7 10*3/uL (ref 0.1–0.9)
Monocytes: 8 %
Neutrophils Absolute: 4.9 10*3/uL (ref 1.4–7.0)
Neutrophils: 58 %
Platelets: 303 10*3/uL (ref 150–450)
RBC: 4.87 x10E6/uL (ref 4.14–5.80)
RDW: 12.6 % (ref 11.6–15.4)
WBC: 8.5 10*3/uL (ref 3.4–10.8)

## 2020-10-09 LAB — PSA: Prostate Specific Ag, Serum: 0.5 ng/mL (ref 0.0–4.0)

## 2020-10-09 LAB — HEPATITIS C ANTIBODY: Hep C Virus Ab: <0.1 s/co ratio (ref 0.0–0.9)

## 2020-10-10 ENCOUNTER — Telehealth: Payer: Self-pay

## 2020-10-10 NOTE — Telephone Encounter (Signed)
Called pt let him know that he is supposed to be taking 20 MG of Crestor. Pt verbalized understanding.  KP

## 2020-10-10 NOTE — Telephone Encounter (Signed)
Copied from [place] [phone]. Topic: General - Other >> Oct 10, 2020  1:44 PM Leward Quan A wrote: Reason for CRM: Patient wife called in to inquire if there was a mistake made on the renewal of his Rx for rosuvastatin (CRESTOR) 20 MG tablet. Say that Rx last sent to the pharmacy was 10 MG and need to know if this should be or was it just a typo. Please call Juliann Pulse at Ph# [phone]

## 2020-10-30 ENCOUNTER — Telehealth: Payer: Self-pay

## 2020-10-30 ENCOUNTER — Other Ambulatory Visit: Payer: Self-pay

## 2020-10-30 DIAGNOSIS — E782 Mixed hyperlipidemia: Secondary | ICD-10-CM

## 2020-10-30 MED ORDER — ROSUVASTATIN CALCIUM 20 MG PO TABS: 20.0000 mg | ORAL_TABLET | Freq: Every day | ORAL | 5 refills | Status: DC

## 2020-10-30 NOTE — Telephone Encounter (Signed)
Called and clarified patient does take 20 mg daily. Sent in prescription correctly.

## 2020-10-30 NOTE — Telephone Encounter (Signed)
Copied from [place] [phone]. Topic: General - Other >> Oct 30, 2020 11:02 AM Pawlus, [name] wrote: Reason for CRM: Abigail Butts the pharmacist called and needed clarification regarding rosuvastatin (CRESTOR) 10 MG tablet, stated PT usually gets 20mg  tablets. Please advise.

## 2020-12-24 ENCOUNTER — Other Ambulatory Visit: Payer: Self-pay

## 2020-12-24 MED ORDER — ESOMEPRAZOLE MAGNESIUM 40 MG PO CPDR: 40.0000 mg | DELAYED_RELEASE_CAPSULE | Freq: Every day | ORAL | 0 refills | Status: DC

## 2021-03-27 ENCOUNTER — Other Ambulatory Visit: Payer: Self-pay | Admitting: Internal Medicine

## 2021-04-23 ENCOUNTER — Other Ambulatory Visit: Payer: Self-pay | Admitting: Internal Medicine

## 2021-04-23 DIAGNOSIS — E782 Mixed hyperlipidemia: Secondary | ICD-10-CM

## 2021-05-24 ENCOUNTER — Other Ambulatory Visit: Payer: Self-pay | Admitting: Internal Medicine

## 2021-05-24 DIAGNOSIS — E782 Mixed hyperlipidemia: Secondary | ICD-10-CM

## 2021-05-27 ENCOUNTER — Other Ambulatory Visit: Payer: Self-pay

## 2021-06-20 ENCOUNTER — Other Ambulatory Visit: Payer: Self-pay | Admitting: Internal Medicine

## 2021-06-20 DIAGNOSIS — E782 Mixed hyperlipidemia: Secondary | ICD-10-CM

## 2021-06-21 NOTE — Telephone Encounter (Signed)
Requested Prescriptions  Pending Prescriptions Disp Refills  . rosuvastatin (CRESTOR) 20 MG tablet [Pharmacy Med Name: ROSUVASTATIN CALCIUM 20 MG TAB] 90 tablet 0    Sig: Take 1 tablet (20 mg total) by mouth daily.     Cardiovascular:  Antilipid - Statins Failed - 06/20/2021 12:18 PM      Failed - Total Cholesterol in normal range and within 360 days    Cholesterol, Total  Date Value Ref Range Status  10/08/2020 204 (H) 100 - 199 mg/dL Final         Failed - LDL in normal range and within 360 days    LDL Chol Calc (NIH)  Date Value Ref Range Status  10/08/2020 128 (H) 0 - 99 mg/dL Final         Passed - HDL in normal range and within 360 days    HDL  Date Value Ref Range Status  10/08/2020 50 >39 mg/dL Final         Passed - Triglycerides in normal range and within 360 days    Triglycerides  Date Value Ref Range Status  10/08/2020 149 0 - 149 mg/dL Final         Passed - Patient is not pregnant      Passed - Valid encounter within last 12 months    Recent Outpatient Visits          8 months ago Annual physical exam   [facility] Glean Hess, MD   9 months ago Lipoma of torso   [facility] Glean Hess, MD      Future Appointments            In 3 months Army Melia, Jesse Sans, MD [facility], [facility]

## 2021-07-26 ENCOUNTER — Other Ambulatory Visit: Payer: Self-pay | Admitting: Internal Medicine

## 2021-07-26 NOTE — Telephone Encounter (Signed)
Requested Prescriptions  Pending Prescriptions Disp Refills  . esomeprazole (NEXIUM) 40 MG capsule [Pharmacy Med Name: ESOMEPRAZOLE MAG DR 40 MG CAP] 90 capsule 0    Sig: Take 1 capsule (40 mg total) by mouth daily.     Gastroenterology: Proton Pump Inhibitors Passed - 07/26/2021 12:50 PM      Passed - Valid encounter within last 12 months    Recent Outpatient Visits          9 months ago Annual physical exam   [facility] Glean Hess, MD   10 months ago Lipoma of torso   [facility] Glean Hess, MD      Future Appointments            In 2 months Army Melia Jesse Sans, MD [facility], [facility]

## 2021-09-18 ENCOUNTER — Other Ambulatory Visit: Payer: Self-pay | Admitting: Internal Medicine

## 2021-09-18 DIAGNOSIS — E782 Mixed hyperlipidemia: Secondary | ICD-10-CM

## 2021-09-18 NOTE — Telephone Encounter (Signed)
Requested Prescriptions  Pending Prescriptions Disp Refills   rosuvastatin (CRESTOR) 20 MG tablet [Pharmacy Med Name: ROSUVASTATIN CALCIUM 20 MG TAB] 30 tablet 0    Sig: Take 1 tablet (20 mg total) by mouth daily.     Cardiovascular:  Antilipid - Statins Failed - 09/18/2021 12:43 PM      Failed - Total Cholesterol in normal range and within 360 days    Cholesterol, Total  Date Value Ref Range Status  10/08/2020 204 (H) 100 - 199 mg/dL Final         Failed - LDL in normal range and within 360 days    LDL Chol Calc (NIH)  Date Value Ref Range Status  10/08/2020 128 (H) 0 - 99 mg/dL Final         Passed - HDL in normal range and within 360 days    HDL  Date Value Ref Range Status  10/08/2020 50 >39 mg/dL Final         Passed - Triglycerides in normal range and within 360 days    Triglycerides  Date Value Ref Range Status  10/08/2020 149 0 - 149 mg/dL Final         Passed - Patient is not pregnant      Passed - Valid encounter within last 12 months    Recent Outpatient Visits          11 months ago Annual physical exam   [facility] Glean Hess, MD   1 year ago Lipoma of torso   [facility] Glean Hess, MD      Future Appointments            In 3 weeks Army Melia Jesse Sans, MD [facility], [facility]

## 2021-10-11 ENCOUNTER — Encounter: Payer: Self-pay | Admitting: Internal Medicine

## 2021-10-11 ENCOUNTER — Ambulatory Visit (INDEPENDENT_AMBULATORY_CARE_PROVIDER_SITE_OTHER): Payer: 59 | Admitting: Internal Medicine

## 2021-10-11 ENCOUNTER — Other Ambulatory Visit: Payer: Self-pay

## 2021-10-11 VITALS — BP 134/88 | HR 62 | Ht 71.0 in | Wt 173.0 lb

## 2021-10-11 DIAGNOSIS — E782 Mixed hyperlipidemia: Secondary | ICD-10-CM | POA: Diagnosis not present

## 2021-10-11 DIAGNOSIS — Z0000 Encounter for general adult medical examination without abnormal findings: Secondary | ICD-10-CM

## 2021-10-11 DIAGNOSIS — Z125 Encounter for screening for malignant neoplasm of prostate: Secondary | ICD-10-CM

## 2021-10-11 DIAGNOSIS — K219 Gastro-esophageal reflux disease without esophagitis: Secondary | ICD-10-CM | POA: Diagnosis not present

## 2021-10-11 MED ORDER — ESOMEPRAZOLE MAGNESIUM 40 MG PO CPDR: 40.0000 mg | DELAYED_RELEASE_CAPSULE | Freq: Every day | ORAL | 3 refills | Status: DC

## 2021-10-11 NOTE — Progress Notes (Signed)
Date:  10/11/2021   Name:  Clarence Adams   DOB:  [birth year]   MRN:  [id number]   Chief Complaint: No chief complaint on file. Clarence Adams is a 56 y.o. male who presents today for his Complete Annual Exam. He feels well. He reports exercising walking a few days a week. He reports he is sleeping well.   Colonoscopy: 01/2019 repeat 10 yrs  Immunization History  Administered Date(s) Administered   Moderna Sars-Covid-2 Vaccination 08/23/2020   PFIZER(Purple Top)SARS-COV-2 Vaccination 12/27/2019, 01/20/2020   Tdap 10/08/2020    Lab Results  Component Value Date   PSA1 0.5 10/08/2020    Hyperlipidemia This is a chronic problem. The problem is controlled. Pertinent negatives include no chest pain, myalgias or shortness of breath. Current antihyperlipidemic treatment includes statins.  Gastroesophageal Reflux He complains of heartburn. He reports no abdominal pain, no chest pain, no choking or no wheezing. This is a recurrent problem. The problem occurs rarely. The heartburn is located in the substernum. Pertinent negatives include no fatigue. He has tried a PPI for the symptoms. The treatment provided significant relief.   Lab Results  Component Value Date   NA 140 10/08/2020   K 4.3 10/08/2020   CO2 23 10/08/2020   GLUCOSE 77 10/08/2020   BUN 14 10/08/2020   CREATININE 1.05 10/08/2020   CALCIUM 9.8 10/08/2020   GFRNONAA 81 10/08/2020   Lab Results  Component Value Date   CHOL 204 (H) 10/08/2020   HDL 50 10/08/2020   LDLCALC 128 (H) 10/08/2020   TRIG 149 10/08/2020   CHOLHDL 4.1 10/08/2020   No results found for: TSH No results found for: HGBA1C Lab Results  Component Value Date   WBC 8.5 10/08/2020   HGB 14.7 10/08/2020   HCT 44.2 10/08/2020   MCV 91 10/08/2020   PLT 303 10/08/2020   Lab Results  Component Value Date   ALT 28 10/08/2020   AST 21 10/08/2020   ALKPHOS 70 10/08/2020   BILITOT 0.6 10/08/2020   No results found for: 25OHVITD2, 25OHVITD3, VD25OH    Review of Systems  Constitutional:  Negative for appetite change, chills, diaphoresis, fatigue and unexpected weight change.  HENT:  Negative for hearing loss, tinnitus, trouble swallowing and voice change.   Eyes:  Negative for visual disturbance.  Respiratory:  Negative for choking, shortness of breath and wheezing.   Cardiovascular:  Negative for chest pain, palpitations and leg swelling.  Gastrointestinal:  Positive for heartburn. Negative for abdominal pain, blood in stool, constipation and diarrhea.  Genitourinary:  Negative for difficulty urinating, dysuria and frequency.  Musculoskeletal:  Negative for arthralgias, back pain and myalgias.  Skin:  Negative for color change and rash.  Neurological:  Negative for dizziness, syncope and headaches.  Hematological:  Negative for adenopathy.  Psychiatric/Behavioral:  Negative for dysphoric mood and sleep disturbance. The patient is not nervous/anxious.    Patient Active Problem List   Diagnosis Date Noted   Hx of adenomatous colonic polyps 09/11/2020   Tinea versicolor 09/11/2020   Mixed hyperlipidemia 09/11/2020   Lipoma of torso 09/11/2020   Encounter for screening colonoscopy 11/09/2018   GERD without esophagitis 11/09/2018    No Known Allergies  Past Surgical History:  Procedure Laterality Date   COLONOSCOPY WITH PROPOFOL N/A 01/26/2019   Procedure: COLONOSCOPY WITH PROPOFOL;  Surgeon: Robert Bellow, MD;  Location: [facility] ENDOSCOPY;  Service: Endoscopy;  Laterality: N/A;   ESOPHAGOGASTRODUODENOSCOPY (EGD) WITH PROPOFOL N/A 01/26/2019   Procedure: ESOPHAGOGASTRODUODENOSCOPY (EGD)  WITH PROPOFOL;  Surgeon: Robert Bellow, MD;  Location: [facility] ENDOSCOPY;  Service: Endoscopy;  Laterality: N/A;   FOOT SURGERY Right 2016   NO PAST SURGERIES      Social History   Tobacco Use   Smoking status: Never   Smokeless tobacco: Never  Vaping Use   Vaping Use: Never used  Substance Use Topics   Alcohol use: Yes     Alcohol/week: 1.0 standard drink    Types: 1 Standard drinks or equivalent per week    Comment: rarely   Drug use: No     Medication list has been reviewed and updated.  Current Meds  Medication Sig   CALCIUM PO Take by mouth daily.   Multiple Vitamin (MULTIVITAMIN) tablet Take 1 tablet by mouth daily.   rosuvastatin (CRESTOR) 20 MG tablet Take 1 tablet (20 mg total) by mouth daily.   selenium sulfide (SELSUN) 2.5 % shampoo daily as needed.   [DISCONTINUED] esomeprazole (NEXIUM) 40 MG capsule Take 1 capsule (40 mg total) by mouth daily.   [DISCONTINUED] neomycin-polymyxin-hydrocortisone (CORTISPORIN) OTIC solution Place 3 drops into the right ear 3 (three) times daily.    PHQ 2/9 Scores 10/11/2021 10/08/2020 09/11/2020  PHQ - 2 Score 0 0 0  PHQ- 9 Score 0 0 0    GAD 7 : Generalized Anxiety Score 10/11/2021 10/08/2020 09/11/2020  Nervous, Anxious, on Edge 0 0 0  Control/stop worrying 0 0 0  Worry too much - different things 0 0 0  Trouble relaxing 0 0 0  Restless 0 0 0  Easily annoyed or irritable 0 0 0  Afraid - awful might happen 0 0 0  Total GAD 7 Score 0 0 0  Anxiety Difficulty - - Not difficult at all    BP Readings from Last 3 Encounters:  10/11/21 134/88  10/08/20 124/86  09/11/20 122/70    Physical Exam Vitals and nursing note reviewed.  Constitutional:      Appearance: Normal appearance. He is well-developed.  HENT:     Head: Normocephalic.     Right Ear: Tympanic membrane, ear canal and external ear normal.     Left Ear: Tympanic membrane, ear canal and external ear normal.     Nose: Nose normal.  Eyes:     Conjunctiva/sclera: Conjunctivae normal.     Pupils: Pupils are equal, round, and reactive to light.  Neck:     Thyroid: No thyromegaly.     Vascular: No carotid bruit.  Cardiovascular:     Rate and Rhythm: Normal rate and regular rhythm.     Heart sounds: Normal heart sounds.  Pulmonary:     Effort: Pulmonary effort is normal.     Breath sounds: Normal  breath sounds. No wheezing.  Chest:  Breasts:    Right: No mass.     Left: No mass.  Abdominal:     General: Bowel sounds are normal.     Palpations: Abdomen is soft.     Tenderness: There is no abdominal tenderness.  Musculoskeletal:        General: Normal range of motion.     Cervical back: Normal range of motion and neck supple.  Lymphadenopathy:     Cervical: No cervical adenopathy.  Skin:    General: Skin is warm and dry.  Neurological:     Mental Status: He is alert and oriented to person, place, and time.     Deep Tendon Reflexes: Reflexes are normal and symmetric.  Psychiatric:  Attention and Perception: Attention normal.        Mood and Affect: Mood normal.        Thought Content: Thought content normal.    Wt Readings from Last 3 Encounters:  10/11/21 173 lb (78.5 kg)  10/08/20 170 lb (77.1 kg)  09/11/20 171 lb (77.6 kg)    BP 134/88 (BP Location: Right Arm, Cuff Size: Large)    Pulse 62    Ht 5\' 11"  (1.803 m)    Wt 173 lb (78.5 kg)    SpO2 96%    BMI 24.13 kg/m   Assessment and Plan: 1. Annual physical exam Normal exam; continue healthy diet and exercise Up to date on screenings and immunizations. He declines Shingrix vaccine due to no hx of varicella  2. Prostate cancer screening DRE deferred to lack of sx. - PSA  3. GERD without esophagitis Symptoms well controlled on daily PPI No red flag signs such as weight loss, n/v, melena Will continue Nexium. - CBC with Differential/Platelet - esomeprazole (NEXIUM) 40 MG capsule; Take 1 capsule (40 mg total) by mouth daily.  Dispense: 90 capsule; Refill: 3  4. Mixed hyperlipidemia Tolerating statin medication without side effects at this time LDL is at goal of < 70 on current dose Continue same therapy without change at this time. - Comprehensive metabolic panel - Lipid panel   Partially dictated using Dragon software. Any errors are unintentional.  Halina Maidens, MD [facility]  10/11/2021

## 2021-10-12 LAB — LIPID PANEL
Chol/HDL Ratio: 4.4 ratio (ref 0.0–5.0)
Cholesterol, Total: 199 mg/dL (ref 100–199)
HDL: 45 mg/dL (ref >39)
LDL Chol Calc (NIH): 126 mg/dL — ABNORMAL HIGH (ref 0–99)
Triglycerides: 155 mg/dL — ABNORMAL HIGH (ref 0–149)
VLDL Cholesterol Cal: 28 mg/dL (ref 5–40)

## 2021-10-12 LAB — CBC WITH DIFFERENTIAL/PLATELET
Basophils Absolute: 0.1 10*3/uL (ref 0.0–0.2)
Basos: 1 %
EOS (ABSOLUTE): 0.2 10*3/uL (ref 0.0–0.4)
Eos: 3 %
Hematocrit: 45.9 % (ref 37.5–51.0)
Hemoglobin: 15.3 g/dL (ref 13.0–17.7)
Immature Grans (Abs): 0 10*3/uL (ref 0.0–0.1)
Immature Granulocytes: 0 %
Lymphocytes Absolute: 2.5 10*3/uL (ref 0.7–3.1)
Lymphs: 31 %
MCH: 30.5 pg (ref 26.6–33.0)
MCHC: 33.3 g/dL (ref 31.5–35.7)
MCV: 92 fL (ref 79–97)
Monocytes Absolute: 0.7 10*3/uL (ref 0.1–0.9)
Monocytes: 9 %
Neutrophils Absolute: 4.6 10*3/uL (ref 1.4–7.0)
Neutrophils: 56 %
Platelets: 300 10*3/uL (ref 150–450)
RBC: 5.01 x10E6/uL (ref 4.14–5.80)
RDW: 12.9 % (ref 11.6–15.4)
WBC: 8.2 10*3/uL (ref 3.4–10.8)

## 2021-10-12 LAB — COMPREHENSIVE METABOLIC PANEL
ALT: 38 IU/L (ref 0–44)
AST: 29 IU/L (ref 0–40)
Albumin/Globulin Ratio: 2.8 — ABNORMAL HIGH (ref 1.2–2.2)
Albumin: 5.1 g/dL — ABNORMAL HIGH (ref 3.8–4.9)
Alkaline Phosphatase: 76 IU/L (ref 44–121)
BUN/Creatinine Ratio: 14 (ref 9–20)
BUN: 14 mg/dL (ref 6–24)
Bilirubin Total: 0.8 mg/dL (ref 0.0–1.2)
CO2: 23 mmol/L (ref 20–29)
Calcium: 9.9 mg/dL (ref 8.7–10.2)
Chloride: 101 mmol/L (ref 96–106)
Creatinine, Ser: 1.02 mg/dL (ref 0.76–1.27)
Globulin, Total: 1.8 g/dL (ref 1.5–4.5)
Glucose: 83 mg/dL (ref 70–99)
Potassium: 4.5 mmol/L (ref 3.5–5.2)
Sodium: 140 mmol/L (ref 134–144)
Total Protein: 6.9 g/dL (ref 6.0–8.5)
eGFR: 88 mL/min/{1.73_m2} (ref >59)

## 2021-10-12 LAB — PSA: Prostate Specific Ag, Serum: 0.5 ng/mL (ref 0.0–4.0)

## 2021-12-26 ENCOUNTER — Telehealth: Payer: Self-pay

## 2021-12-26 NOTE — Telephone Encounter (Signed)
Message left for the patient to call back to let us know if he would like a referral to [facility] to have his colonoscopy done or if he would like to see Dr Bary Castilla at [facility].  ?

## 2022-02-04 ENCOUNTER — Telehealth: Payer: Self-pay

## 2022-02-04 NOTE — Telephone Encounter (Signed)
Completed Pa on [email or web address] for Esomeprazole 40 mg Daily - It was APPROVED.

## 2022-02-26 ENCOUNTER — Other Ambulatory Visit: Payer: Self-pay | Admitting: Internal Medicine

## 2022-02-26 DIAGNOSIS — E782 Mixed hyperlipidemia: Secondary | ICD-10-CM

## 2022-02-26 NOTE — Telephone Encounter (Signed)
Continue meds per last lab/lipid check Requested Prescriptions  Pending Prescriptions Disp Refills  . rosuvastatin (CRESTOR) 20 MG tablet [Pharmacy Med Name: ROSUVASTATIN CALCIUM 20 MG TAB] 90 tablet 2    Sig: Take 1 tablet (20 mg total) by mouth daily.     Cardiovascular:  Antilipid - Statins 2 Failed - 02/26/2022 11:31 AM      Failed - Lipid Panel in normal range within the last 12 months    Cholesterol, Total  Date Value Ref Range Status  10/11/2021 199 100 - 199 mg/dL Final   LDL Chol Calc (NIH)  Date Value Ref Range Status  10/11/2021 126 (H) 0 - 99 mg/dL Final   HDL  Date Value Ref Range Status  10/11/2021 45 >39 mg/dL Final   Triglycerides  Date Value Ref Range Status  10/11/2021 155 (H) 0 - 149 mg/dL Final         Passed - Cr in normal range and within 360 days    Creatinine, Ser  Date Value Ref Range Status  10/11/2021 1.02 0.76 - 1.27 mg/dL Final         Passed - Patient is not pregnant      Passed - Valid encounter within last 12 months    Recent Outpatient Visits          4 months ago Annual physical exam   [facility] Glean Hess, MD   1 year ago Annual physical exam   [facility] Glean Hess, MD   1 year ago Lipoma of torso   [facility] Glean Hess, MD

## 2022-04-22 DIAGNOSIS — L720 Epidermal cyst: Secondary | ICD-10-CM | POA: Diagnosis not present

## 2022-05-12 DIAGNOSIS — R0682 Tachypnea, not elsewhere classified: Secondary | ICD-10-CM | POA: Diagnosis not present

## 2022-05-12 DIAGNOSIS — R059 Cough, unspecified: Secondary | ICD-10-CM | POA: Diagnosis not present

## 2022-05-12 DIAGNOSIS — R9431 Abnormal electrocardiogram [ECG] [EKG]: Secondary | ICD-10-CM | POA: Diagnosis not present

## 2022-05-12 DIAGNOSIS — R5383 Other fatigue: Secondary | ICD-10-CM | POA: Diagnosis not present

## 2022-05-12 DIAGNOSIS — R519 Headache, unspecified: Secondary | ICD-10-CM | POA: Diagnosis not present

## 2022-05-12 DIAGNOSIS — D72829 Elevated white blood cell count, unspecified: Secondary | ICD-10-CM | POA: Diagnosis not present

## 2022-05-12 DIAGNOSIS — R109 Unspecified abdominal pain: Secondary | ICD-10-CM | POA: Diagnosis not present

## 2022-05-12 DIAGNOSIS — E785 Hyperlipidemia, unspecified: Secondary | ICD-10-CM | POA: Diagnosis not present

## 2022-05-12 DIAGNOSIS — K219 Gastro-esophageal reflux disease without esophagitis: Secondary | ICD-10-CM | POA: Diagnosis not present

## 2022-05-12 DIAGNOSIS — Z20822 Contact with and (suspected) exposure to covid-19: Secondary | ICD-10-CM | POA: Diagnosis not present

## 2022-05-12 DIAGNOSIS — R0602 Shortness of breath: Secondary | ICD-10-CM | POA: Diagnosis not present

## 2022-05-12 DIAGNOSIS — R1110 Vomiting, unspecified: Secondary | ICD-10-CM | POA: Diagnosis not present

## 2022-05-12 DIAGNOSIS — R6884 Jaw pain: Secondary | ICD-10-CM | POA: Diagnosis not present

## 2022-05-12 DIAGNOSIS — R1084 Generalized abdominal pain: Secondary | ICD-10-CM | POA: Diagnosis not present

## 2022-05-12 DIAGNOSIS — R197 Diarrhea, unspecified: Secondary | ICD-10-CM | POA: Diagnosis not present

## 2022-05-12 DIAGNOSIS — R000 Tachycardia, unspecified: Secondary | ICD-10-CM | POA: Diagnosis not present

## 2022-05-12 DIAGNOSIS — R112 Nausea with vomiting, unspecified: Secondary | ICD-10-CM | POA: Diagnosis not present

## 2022-05-13 DIAGNOSIS — K219 Gastro-esophageal reflux disease without esophagitis: Secondary | ICD-10-CM | POA: Diagnosis not present

## 2022-05-13 DIAGNOSIS — E785 Hyperlipidemia, unspecified: Secondary | ICD-10-CM | POA: Diagnosis not present

## 2022-05-13 DIAGNOSIS — Z79899 Other long term (current) drug therapy: Secondary | ICD-10-CM | POA: Diagnosis not present

## 2022-05-13 DIAGNOSIS — R112 Nausea with vomiting, unspecified: Secondary | ICD-10-CM | POA: Diagnosis not present

## 2022-05-16 ENCOUNTER — Ambulatory Visit (INDEPENDENT_AMBULATORY_CARE_PROVIDER_SITE_OTHER): Payer: BC Managed Care – PPO | Admitting: Internal Medicine

## 2022-05-16 ENCOUNTER — Encounter: Payer: Self-pay | Admitting: Internal Medicine

## 2022-05-16 VITALS — BP 136/92 | HR 72 | Ht 71.0 in | Wt 173.0 lb

## 2022-05-16 DIAGNOSIS — A084 Viral intestinal infection, unspecified: Secondary | ICD-10-CM

## 2022-05-16 NOTE — Progress Notes (Signed)
Date:  05/16/2022   Name:  Clarence Adams   DOB:  [birth year]   MRN:  [id number]   Chief Complaint: Hospitalization Follow-up (Has not got back appetite, feels light headed) Patient is here for hospital follow up - went to ER at [facility] on 05/12/22.  Hospital Course:  Clarence Adams is a 56 y.o. male with a past medical history significant for HLD, GERD who presented with 2 days of intractable nausea vomiting and diarrhea inability to tolerate p.o. Symptoms overall consistent with gastroenteritis. Both vomiting and diarrhea and recent sick contact of life. Patient was treated with IV Zofran, IV Phenergan, received 2 L of fluid, and 1 dose of Toradol. Patient was able to tolerate oral intake without symptoms of diarrhea or vomiting. Patient was deemed safe and stable for discharge home on 9/5, tolerating oral diet without difficulty. Discharged home to self-care with instructions to follow-up with PCP and a short prescription for oral Zofran to use as needed. HPI He is doing much better.  Keeping down all intake,  last zofran was yesterday.  Diarrhea has resolved.  He is slowly advancing his diet.   Lab Results  Component Value Date   NA 140 10/11/2021   K 4.5 10/11/2021   CO2 23 10/11/2021   GLUCOSE 83 10/11/2021   BUN 14 10/11/2021   CREATININE 1.02 10/11/2021   CALCIUM 9.9 10/11/2021   EGFR 88 10/11/2021   GFRNONAA 81 10/08/2020   Lab Results  Component Value Date   CHOL 199 10/11/2021   HDL 45 10/11/2021   LDLCALC 126 (H) 10/11/2021   TRIG 155 (H) 10/11/2021   CHOLHDL 4.4 10/11/2021   No results found for: "TSH" No results found for: "HGBA1C" Lab Results  Component Value Date   WBC 8.2 10/11/2021   HGB 15.3 10/11/2021   HCT 45.9 10/11/2021   MCV 92 10/11/2021   PLT 300 10/11/2021   Lab Results  Component Value Date   ALT 38 10/11/2021   AST 29 10/11/2021   ALKPHOS 76 10/11/2021   BILITOT 0.8 10/11/2021   No results found for: "25OHVITD2", "25OHVITD3", "VD25OH"   Review  of Systems  Constitutional:  Negative for appetite change, chills, fatigue and fever.  Respiratory:  Negative for chest tightness and shortness of breath.   Gastrointestinal:  Positive for nausea. Negative for abdominal pain, blood in stool and vomiting.  Neurological:  Positive for light-headedness. Negative for headaches.  Psychiatric/Behavioral:  Negative for sleep disturbance. The patient is not nervous/anxious.     Patient Active Problem List   Diagnosis Date Noted   Hx of adenomatous colonic polyps 09/11/2020   Tinea versicolor 09/11/2020   Mixed hyperlipidemia 09/11/2020   Lipoma of torso 09/11/2020   Encounter for screening colonoscopy 11/09/2018   GERD without esophagitis 11/09/2018    No Known Allergies  Past Surgical History:  Procedure Laterality Date   COLONOSCOPY WITH PROPOFOL N/A 01/26/2019   Procedure: COLONOSCOPY WITH PROPOFOL;  Surgeon: Robert Bellow, MD;  Location: [facility] ENDOSCOPY;  Service: Endoscopy;  Laterality: N/A;   ESOPHAGOGASTRODUODENOSCOPY (EGD) WITH PROPOFOL N/A 01/26/2019   Procedure: ESOPHAGOGASTRODUODENOSCOPY (EGD) WITH PROPOFOL;  Surgeon: Robert Bellow, MD;  Location: [facility] ENDOSCOPY;  Service: Endoscopy;  Laterality: N/A;   FOOT SURGERY Right 2016   NO PAST SURGERIES      Social History   Tobacco Use   Smoking status: Never   Smokeless tobacco: Never  Vaping Use   Vaping Use: Never used  Substance Use Topics  Alcohol use: Yes    Alcohol/week: 1.0 standard drink of alcohol    Types: 1 Standard drinks or equivalent per week    Comment: rarely   Drug use: No     Medication list has been reviewed and updated.  Current Meds  Medication Sig   CALCIUM PO Take by mouth daily.   esomeprazole (NEXIUM) 40 MG capsule Take 1 capsule (40 mg total) by mouth daily.   Multiple Vitamin (MULTIVITAMIN) tablet Take 1 tablet by mouth daily.   rosuvastatin (CRESTOR) 20 MG tablet Take 1 tablet (20 mg total) by mouth daily.   selenium sulfide  (SELSUN) 2.5 % shampoo daily as needed.       05/16/2022    2:01 PM 10/11/2021    3:06 PM 10/08/2020    2:39 PM 09/11/2020    3:02 PM  GAD 7 : Generalized Anxiety Score  Nervous, Anxious, on Edge 0 0 0 0  Control/stop worrying 0 0 0 0  Worry too much - different things 0 0 0 0  Trouble relaxing 0 0 0 0  Restless 0 0 0 0  Easily annoyed or irritable 0 0 0 0  Afraid - awful might happen 0 0 0 0  Total GAD 7 Score 0 0 0 0  Anxiety Difficulty Not difficult at all   Not difficult at all       05/16/2022    2:01 PM 10/11/2021    3:06 PM 10/08/2020    2:39 PM  Depression screen PHQ 2/9  Decreased Interest 0 0 0  Down, Depressed, Hopeless 0 0 0  PHQ - 2 Score 0 0 0  Altered sleeping 0 0 0  Tired, decreased energy 1 0 0  Change in appetite 2 0 0  Feeling bad or failure about yourself  0 0 0  Trouble concentrating 0 0 0  Moving slowly or fidgety/restless 0 0 0  Suicidal thoughts 0 0 0  PHQ-9 Score 3 0 0  Difficult doing work/chores Not difficult at all      BP Readings from Last 3 Encounters:  05/16/22 (!) 136/92  10/11/21 134/88  10/08/20 124/86    Physical Exam Vitals and nursing note reviewed.  Constitutional:      General: He is not in acute distress.    Appearance: Normal appearance. He is well-developed.  HENT:     Head: Normocephalic and atraumatic.  Cardiovascular:     Rate and Rhythm: Normal rate and regular rhythm.  Pulmonary:     Effort: Pulmonary effort is normal. No respiratory distress.     Breath sounds: No wheezing or rhonchi.  Musculoskeletal:     Cervical back: Normal range of motion.     Right lower leg: No edema.     Left lower leg: No edema.  Lymphadenopathy:     Cervical: No cervical adenopathy.  Skin:    General: Skin is warm and dry.     Findings: No rash.  Neurological:     Mental Status: He is alert and oriented to person, place, and time.  Psychiatric:        Mood and Affect: Mood normal.        Behavior: Behavior normal.     Wt  Readings from Last 3 Encounters:  05/16/22 173 lb (78.5 kg)  10/11/21 173 lb (78.5 kg)  10/08/20 170 lb (77.1 kg)    BP (!) 136/92 (BP Location: Right Arm, Cuff Size: Normal)   Pulse 72   Ht '5\' 11"'  (  1.803 m)   Wt 173 lb (78.5 kg)   SpO2 98%   BMI 24.13 kg/m   Assessment and Plan: 1. Viral gastroenteritis Improving after IV fluids Continue Zofran if needed. Advance diet as tolerated and return if needed CT abd/pelvis was normal.    Partially dictated using Editor, commissioning. Any errors are unintentional.  Halina Maidens, MD [facility]  05/16/2022

## 2022-06-05 DIAGNOSIS — Z1159 Encounter for screening for other viral diseases: Secondary | ICD-10-CM | POA: Diagnosis not present

## 2022-06-05 DIAGNOSIS — Z1211 Encounter for screening for malignant neoplasm of colon: Secondary | ICD-10-CM | POA: Diagnosis not present

## 2022-06-05 DIAGNOSIS — B360 Pityriasis versicolor: Secondary | ICD-10-CM | POA: Diagnosis not present

## 2022-06-05 DIAGNOSIS — E785 Hyperlipidemia, unspecified: Secondary | ICD-10-CM | POA: Diagnosis not present

## 2022-06-05 DIAGNOSIS — Z23 Encounter for immunization: Secondary | ICD-10-CM | POA: Diagnosis not present

## 2022-06-05 DIAGNOSIS — K219 Gastro-esophageal reflux disease without esophagitis: Secondary | ICD-10-CM | POA: Diagnosis not present

## 2022-07-10 DIAGNOSIS — N529 Male erectile dysfunction, unspecified: Secondary | ICD-10-CM | POA: Diagnosis not present

## 2022-07-10 DIAGNOSIS — R079 Chest pain, unspecified: Secondary | ICD-10-CM | POA: Diagnosis not present

## 2022-07-10 DIAGNOSIS — I10 Essential (primary) hypertension: Secondary | ICD-10-CM | POA: Diagnosis not present

## 2022-07-10 DIAGNOSIS — Z8249 Family history of ischemic heart disease and other diseases of the circulatory system: Secondary | ICD-10-CM | POA: Diagnosis not present

## 2022-08-08 DIAGNOSIS — U071 COVID-19: Secondary | ICD-10-CM | POA: Diagnosis not present

## 2022-08-15 DIAGNOSIS — R519 Headache, unspecified: Secondary | ICD-10-CM | POA: Diagnosis not present

## 2022-08-15 DIAGNOSIS — G8929 Other chronic pain: Secondary | ICD-10-CM | POA: Diagnosis not present

## 2022-08-15 DIAGNOSIS — H5710 Ocular pain, unspecified eye: Secondary | ICD-10-CM | POA: Diagnosis not present

## 2022-08-21 DIAGNOSIS — Z1211 Encounter for screening for malignant neoplasm of colon: Secondary | ICD-10-CM | POA: Diagnosis not present

## 2022-08-21 DIAGNOSIS — I10 Essential (primary) hypertension: Secondary | ICD-10-CM | POA: Diagnosis not present

## 2022-08-21 DIAGNOSIS — Z125 Encounter for screening for malignant neoplasm of prostate: Secondary | ICD-10-CM | POA: Diagnosis not present

## 2022-09-16 DIAGNOSIS — Z8249 Family history of ischemic heart disease and other diseases of the circulatory system: Secondary | ICD-10-CM | POA: Diagnosis not present

## 2022-09-16 DIAGNOSIS — I2510 Atherosclerotic heart disease of native coronary artery without angina pectoris: Secondary | ICD-10-CM | POA: Diagnosis not present

## 2022-09-16 DIAGNOSIS — R079 Chest pain, unspecified: Secondary | ICD-10-CM | POA: Diagnosis not present

## 2022-09-16 DIAGNOSIS — R0609 Other forms of dyspnea: Secondary | ICD-10-CM | POA: Diagnosis not present

## 2022-10-20 ENCOUNTER — Other Ambulatory Visit: Payer: Self-pay | Admitting: Internal Medicine

## 2022-10-20 DIAGNOSIS — K219 Gastro-esophageal reflux disease without esophagitis: Secondary | ICD-10-CM

## 2022-10-21 DIAGNOSIS — I2584 Coronary atherosclerosis due to calcified coronary lesion: Secondary | ICD-10-CM | POA: Diagnosis not present

## 2022-10-21 DIAGNOSIS — I2510 Atherosclerotic heart disease of native coronary artery without angina pectoris: Secondary | ICD-10-CM | POA: Diagnosis not present

## 2022-10-21 DIAGNOSIS — R0609 Other forms of dyspnea: Secondary | ICD-10-CM | POA: Diagnosis not present

## 2022-11-17 ENCOUNTER — Other Ambulatory Visit: Payer: Self-pay | Admitting: Internal Medicine

## 2022-11-17 DIAGNOSIS — K219 Gastro-esophageal reflux disease without esophagitis: Secondary | ICD-10-CM

## 2022-11-20 ENCOUNTER — Other Ambulatory Visit: Payer: Self-pay | Admitting: Internal Medicine

## 2022-11-20 DIAGNOSIS — E782 Mixed hyperlipidemia: Secondary | ICD-10-CM

## 2022-11-20 DIAGNOSIS — Z1211 Encounter for screening for malignant neoplasm of colon: Secondary | ICD-10-CM | POA: Diagnosis not present

## 2022-11-20 DIAGNOSIS — I10 Essential (primary) hypertension: Secondary | ICD-10-CM | POA: Diagnosis not present

## 2022-11-20 DIAGNOSIS — R109 Unspecified abdominal pain: Secondary | ICD-10-CM | POA: Diagnosis not present

## 2022-12-01 DIAGNOSIS — R109 Unspecified abdominal pain: Secondary | ICD-10-CM | POA: Diagnosis not present

## 2022-12-01 DIAGNOSIS — N200 Calculus of kidney: Secondary | ICD-10-CM | POA: Diagnosis not present

## 2022-12-01 DIAGNOSIS — K5900 Constipation, unspecified: Secondary | ICD-10-CM | POA: Diagnosis not present

## 2022-12-02 DIAGNOSIS — I10 Essential (primary) hypertension: Secondary | ICD-10-CM | POA: Diagnosis not present

## 2022-12-02 DIAGNOSIS — K5720 Diverticulitis of large intestine with perforation and abscess without bleeding: Secondary | ICD-10-CM | POA: Diagnosis not present

## 2022-12-02 DIAGNOSIS — R1032 Left lower quadrant pain: Secondary | ICD-10-CM | POA: Diagnosis not present

## 2022-12-02 DIAGNOSIS — N200 Calculus of kidney: Secondary | ICD-10-CM | POA: Diagnosis not present

## 2022-12-02 DIAGNOSIS — Z86010 Personal history of colonic polyps: Secondary | ICD-10-CM | POA: Diagnosis not present

## 2022-12-02 DIAGNOSIS — Z79899 Other long term (current) drug therapy: Secondary | ICD-10-CM | POA: Diagnosis not present

## 2022-12-02 DIAGNOSIS — R195 Other fecal abnormalities: Secondary | ICD-10-CM | POA: Diagnosis not present

## 2022-12-02 DIAGNOSIS — E785 Hyperlipidemia, unspecified: Secondary | ICD-10-CM | POA: Diagnosis not present

## 2022-12-02 DIAGNOSIS — K219 Gastro-esophageal reflux disease without esophagitis: Secondary | ICD-10-CM | POA: Diagnosis not present

## 2022-12-03 DIAGNOSIS — K5720 Diverticulitis of large intestine with perforation and abscess without bleeding: Secondary | ICD-10-CM | POA: Diagnosis not present

## 2022-12-04 DIAGNOSIS — K5720 Diverticulitis of large intestine with perforation and abscess without bleeding: Secondary | ICD-10-CM | POA: Diagnosis not present

## 2022-12-08 DIAGNOSIS — K5720 Diverticulitis of large intestine with perforation and abscess without bleeding: Secondary | ICD-10-CM | POA: Diagnosis not present

## 2023-01-30 DIAGNOSIS — D124 Benign neoplasm of descending colon: Secondary | ICD-10-CM | POA: Diagnosis not present

## 2023-01-30 DIAGNOSIS — Z1211 Encounter for screening for malignant neoplasm of colon: Secondary | ICD-10-CM | POA: Diagnosis not present

## 2023-01-30 DIAGNOSIS — K5730 Diverticulosis of large intestine without perforation or abscess without bleeding: Secondary | ICD-10-CM | POA: Diagnosis not present

## 2023-01-30 DIAGNOSIS — D128 Benign neoplasm of rectum: Secondary | ICD-10-CM | POA: Diagnosis not present

## 2023-01-30 DIAGNOSIS — D126 Benign neoplasm of colon, unspecified: Secondary | ICD-10-CM | POA: Diagnosis not present

## 2023-01-30 DIAGNOSIS — K635 Polyp of colon: Secondary | ICD-10-CM | POA: Diagnosis not present

## 2023-01-30 DIAGNOSIS — Z86010 Personal history of colonic polyps: Secondary | ICD-10-CM | POA: Diagnosis not present

## 2023-01-30 DIAGNOSIS — K621 Rectal polyp: Secondary | ICD-10-CM | POA: Diagnosis not present

## 2023-01-30 DIAGNOSIS — D122 Benign neoplasm of ascending colon: Secondary | ICD-10-CM | POA: Diagnosis not present

## 2023-01-30 DIAGNOSIS — D120 Benign neoplasm of cecum: Secondary | ICD-10-CM | POA: Diagnosis not present

## 2023-07-09 DIAGNOSIS — I10 Essential (primary) hypertension: Secondary | ICD-10-CM | POA: Diagnosis not present

## 2023-07-09 DIAGNOSIS — D126 Benign neoplasm of colon, unspecified: Secondary | ICD-10-CM | POA: Diagnosis not present

## 2023-07-09 DIAGNOSIS — E782 Mixed hyperlipidemia: Secondary | ICD-10-CM | POA: Diagnosis not present

## 2023-07-09 DIAGNOSIS — K219 Gastro-esophageal reflux disease without esophagitis: Secondary | ICD-10-CM | POA: Diagnosis not present

## 2023-07-14 DIAGNOSIS — E782 Mixed hyperlipidemia: Secondary | ICD-10-CM | POA: Diagnosis not present

## 2023-07-14 DIAGNOSIS — R7989 Other specified abnormal findings of blood chemistry: Secondary | ICD-10-CM | POA: Diagnosis not present

## 2023-07-14 DIAGNOSIS — I10 Essential (primary) hypertension: Secondary | ICD-10-CM | POA: Diagnosis not present

## 2023-07-14 DIAGNOSIS — Z125 Encounter for screening for malignant neoplasm of prostate: Secondary | ICD-10-CM | POA: Diagnosis not present

## 2023-07-14 DIAGNOSIS — Z130 Encounter for screening for diseases of the blood and blood-forming organs and certain disorders involving the immune mechanism: Secondary | ICD-10-CM | POA: Diagnosis not present

## 2023-07-14 DIAGNOSIS — R739 Hyperglycemia, unspecified: Secondary | ICD-10-CM | POA: Diagnosis not present

## 2023-07-16 DIAGNOSIS — L0291 Cutaneous abscess, unspecified: Secondary | ICD-10-CM | POA: Diagnosis not present

## 2023-07-27 DIAGNOSIS — M25562 Pain in left knee: Secondary | ICD-10-CM | POA: Diagnosis not present

## 2023-08-13 DIAGNOSIS — M25562 Pain in left knee: Secondary | ICD-10-CM | POA: Diagnosis not present
